# Patient Record
Sex: Female | Born: 1940
Health system: Southern US, Community
[De-identification: ages and names within clinical notes are randomized; demographics above are authoritative.]

## PROBLEM LIST (undated history)

## (undated) DIAGNOSIS — M545 Low back pain, unspecified: Secondary | ICD-10-CM

## (undated) DIAGNOSIS — I10 Essential (primary) hypertension: Secondary | ICD-10-CM

## (undated) DIAGNOSIS — E785 Hyperlipidemia, unspecified: Secondary | ICD-10-CM

## (undated) HISTORY — PX: ABDOMINAL HYSTERECTOMY: SHX81

## (undated) HISTORY — PX: TONSILLECTOMY: SUR1361

## (undated) HISTORY — PX: CHOLECYSTECTOMY: SHX55

## (undated) HISTORY — DX: Essential (primary) hypertension: I10

## (undated) HISTORY — DX: Low back pain, unspecified: M54.50

## (undated) HISTORY — PX: APPENDECTOMY: SHX54

## (undated) HISTORY — DX: Hyperlipidemia, unspecified: E78.5

## (undated) HISTORY — PX: OVARIAN CYST SURGERY: SHX726

## (undated) HISTORY — DX: Low back pain: M54.5

---

## 2002-04-11 ENCOUNTER — Other Ambulatory Visit: Admission: RE | Admit: 2002-04-11 | Discharge: 2002-04-11 | Payer: Self-pay | Admitting: Family Medicine

## 2009-08-05 ENCOUNTER — Ambulatory Visit (HOSPITAL_COMMUNITY): Admission: RE | Admit: 2009-08-05 | Discharge: 2009-08-05 | Payer: Self-pay | Admitting: Obstetrics and Gynecology

## 2009-08-25 ENCOUNTER — Ambulatory Visit (HOSPITAL_COMMUNITY): Admission: RE | Admit: 2009-08-25 | Discharge: 2009-08-25 | Payer: Self-pay | Admitting: Obstetrics and Gynecology

## 2009-12-16 ENCOUNTER — Encounter (INDEPENDENT_AMBULATORY_CARE_PROVIDER_SITE_OTHER): Payer: Self-pay | Admitting: Obstetrics and Gynecology

## 2009-12-16 ENCOUNTER — Inpatient Hospital Stay (HOSPITAL_COMMUNITY): Admission: RE | Admit: 2009-12-16 | Discharge: 2009-12-18 | Payer: Self-pay | Admitting: Obstetrics and Gynecology

## 2010-11-10 LAB — CBC
HCT: 43.8 % (ref 36.0–46.0)
Hemoglobin: 10.8 g/dL — ABNORMAL LOW (ref 12.0–15.0)
Hemoglobin: 14.9 g/dL (ref 12.0–15.0)
MCHC: 34.2 g/dL (ref 30.0–36.0)
MCHC: 34.5 g/dL (ref 30.0–36.0)
MCHC: 34.6 g/dL (ref 30.0–36.0)
Platelets: 161 10*3/uL (ref 150–400)
RBC: 3.15 MIL/uL — ABNORMAL LOW (ref 3.87–5.11)
RBC: 4.85 MIL/uL (ref 3.87–5.11)
RDW: 12.1 % (ref 11.5–15.5)
RDW: 12.2 % (ref 11.5–15.5)

## 2010-11-10 LAB — BASIC METABOLIC PANEL
BUN: 12 mg/dL (ref 6–23)
CO2: 29 mEq/L (ref 19–32)
Calcium: 8.3 mg/dL — ABNORMAL LOW (ref 8.4–10.5)
Creatinine, Ser: 0.94 mg/dL (ref 0.4–1.2)
Glucose, Bld: 147 mg/dL — ABNORMAL HIGH (ref 70–99)
Sodium: 136 mEq/L (ref 135–145)

## 2010-11-10 LAB — COMPREHENSIVE METABOLIC PANEL
ALT: 21 U/L (ref 0–35)
Alkaline Phosphatase: 75 U/L (ref 39–117)
BUN: 15 mg/dL (ref 6–23)
CO2: 28 mEq/L (ref 19–32)
Calcium: 9.2 mg/dL (ref 8.4–10.5)
GFR calc non Af Amer: >60 mL/min (ref >60)
Glucose, Bld: 87 mg/dL (ref 70–99)
Sodium: 141 mEq/L (ref 135–145)
Total Protein: 8 g/dL (ref 6.0–8.3)

## 2010-11-10 LAB — URINALYSIS, ROUTINE W REFLEX MICROSCOPIC
Bilirubin Urine: NEGATIVE
Ketones, ur: NEGATIVE mg/dL
Ketones, ur: NEGATIVE mg/dL
Nitrite: NEGATIVE
Protein, ur: 30 mg/dL — AB
Specific Gravity, Urine: 1.01 (ref 1.005–1.030)
pH: 5.5 (ref 5.0–8.0)
pH: 6.5 (ref 5.0–8.0)

## 2010-11-10 LAB — URINE CULTURE: Colony Count: 65000

## 2010-11-10 LAB — URINE MICROSCOPIC-ADD ON

## 2010-11-23 LAB — COMPREHENSIVE METABOLIC PANEL
ALT: 22 U/L (ref 0–35)
AST: 23 U/L (ref 0–37)
CO2: 29 mEq/L (ref 19–32)
Chloride: 102 mEq/L (ref 96–112)
Creatinine, Ser: 0.66 mg/dL (ref 0.4–1.2)
GFR calc Af Amer: >60 mL/min (ref >60)
GFR calc non Af Amer: >60 mL/min (ref >60)
Sodium: 138 mEq/L (ref 135–145)
Total Bilirubin: 0.6 mg/dL (ref 0.3–1.2)

## 2010-11-23 LAB — URINALYSIS, ROUTINE W REFLEX MICROSCOPIC
Bilirubin Urine: NEGATIVE
Glucose, UA: NEGATIVE mg/dL
Ketones, ur: NEGATIVE mg/dL
pH: 5.5 (ref 5.0–8.0)

## 2010-11-23 LAB — CBC
MCV: 92.7 fL (ref 78.0–100.0)
RBC: 4.94 MIL/uL (ref 3.87–5.11)
WBC: 8.8 10*3/uL (ref 4.0–10.5)

## 2012-11-07 ENCOUNTER — Other Ambulatory Visit: Payer: Self-pay | Admitting: *Deleted

## 2012-11-14 ENCOUNTER — Telehealth: Payer: Self-pay | Admitting: Family Medicine

## 2012-11-14 NOTE — Telephone Encounter (Signed)
Patient states that [facility] sent over a refill request for her Lovastatin last week and they have not heard back from our office. The patient has been without her medication for about a week now.

## 2012-11-24 ENCOUNTER — Encounter: Payer: Self-pay | Admitting: Family Medicine

## 2012-11-24 ENCOUNTER — Ambulatory Visit (INDEPENDENT_AMBULATORY_CARE_PROVIDER_SITE_OTHER): Payer: Medicare Other | Admitting: Family Medicine

## 2012-11-24 VITALS — BP 158/78 | HR 77 | Temp 97.0°F | Ht 65.0 in | Wt 159.8 lb

## 2012-11-24 DIAGNOSIS — I10 Essential (primary) hypertension: Secondary | ICD-10-CM | POA: Insufficient documentation

## 2012-11-24 DIAGNOSIS — M549 Dorsalgia, unspecified: Secondary | ICD-10-CM

## 2012-11-24 DIAGNOSIS — E785 Hyperlipidemia, unspecified: Secondary | ICD-10-CM

## 2012-11-24 DIAGNOSIS — G8929 Other chronic pain: Secondary | ICD-10-CM

## 2012-11-24 DIAGNOSIS — E559 Vitamin D deficiency, unspecified: Secondary | ICD-10-CM | POA: Insufficient documentation

## 2012-11-24 MED ORDER — BENAZEPRIL-HYDROCHLOROTHIAZIDE 20-25 MG PO TABS: 1.0000 | ORAL_TABLET | Freq: Every day | ORAL | Status: DC

## 2012-11-24 MED ORDER — ACETAMINOPHEN-CODEINE #3 300-30 MG PO TABS: 1.0000 | ORAL_TABLET | Freq: Four times a day (QID) | ORAL | Status: DC | PRN

## 2012-11-24 MED ORDER — AMLODIPINE BESYLATE 5 MG PO TABS: 7.5000 mg | ORAL_TABLET | Freq: Every day | ORAL | Status: DC

## 2012-11-24 MED ORDER — LOVASTATIN 40 MG PO TABS: 40.0000 mg | ORAL_TABLET | Freq: Every day | ORAL | Status: DC

## 2012-11-24 NOTE — Patient Instructions (Signed)
Diet and Exercise discussed with patient. For nutrition information, I recommend books: Eat to Live by Dr Monico Hoar. Prevent and Reverse Heart Disease by Dr Suzzette Righter.  Exercise recommendations are:  If unable to walk, then the patient can exercise in a chair 3 times a day. By flapping arms like a bird gently and raising legs outwards to the front.  If ambulatory, the patient can go for walks for 30 minutes 3 times a week. Then increase the intensity and duration as tolerated. Goal is to try to attain exercise frequency to 5 times a week. Best to perform resistance exercises 2 days a week and cardio type exercises 3 days per week. Hypertension As your heart beats, it forces blood through your arteries. This force is your blood pressure. If the pressure is too high, it is called hypertension (HTN) or high blood pressure. HTN is dangerous because you may have it and not know it. High blood pressure may mean that your heart has to work harder to pump blood. Your arteries may be narrow or stiff. The extra work puts you at risk for heart disease, stroke, and other problems.  Blood pressure consists of two numbers, a higher number over a lower, 110/72, for example. It is stated as "110 over 72." The ideal is below 120 for the top number (systolic) and under 80 for the bottom (diastolic). Write down your blood pressure today. You should pay close attention to your blood pressure if you have certain conditions such as:  Heart failure.  Prior heart attack.  Diabetes  Chronic kidney disease.  Prior stroke.  Multiple risk factors for heart disease. To see if you have HTN, your blood pressure should be measured while you are seated with your arm held at the level of the heart. It should be measured at least twice. A one-time elevated blood pressure reading (especially in the Emergency Department) does not mean that you need treatment. There may be conditions in which the blood pressure is  different between your right and left arms. It is important to see your caregiver soon for a recheck. Most people have essential hypertension which means that there is not a specific cause. This type of high blood pressure may be lowered by changing lifestyle factors such as:  Stress.  Smoking.  Lack of exercise.  Excessive weight.  Drug/tobacco/alcohol use.  Eating less salt. Most people do not have symptoms from high blood pressure until it has caused damage to the body. Effective treatment can often prevent, delay or reduce that damage. TREATMENT  When a cause has been identified, treatment for high blood pressure is directed at the cause. There are a large number of medications to treat HTN. These fall into several categories, and your caregiver will help you select the medicines that are best for you. Medications may have side effects. You should review side effects with your caregiver. If your blood pressure stays high after you have made lifestyle changes or started on medicines,   Your medication(s) may need to be changed.  Other problems may need to be addressed.  Be certain you understand your prescriptions, and know how and when to take your medicine.  Be sure to follow up with your caregiver within the time frame advised (usually within two weeks) to have your blood pressure rechecked and to review your medications.  If you are taking more than one medicine to lower your blood pressure, make sure you know how and at what times they should  be taken. Taking two medicines at the same time can result in blood pressure that is too low. SEEK IMMEDIATE MEDICAL CARE IF:  You develop a severe headache, blurred or changing vision, or confusion.  You have unusual weakness or numbness, or a faint feeling.  You have severe chest or abdominal pain, vomiting, or breathing problems. MAKE SURE YOU:   Understand these instructions.  Will watch your condition.  Will get help right  away if you are not doing well or get worse. Document Released: 08/09/2005 Document Revised: 11/01/2011 Document Reviewed: 03/29/2008 [facility] Patient Information 2013 [place], [place].  Hypertriglyceridemia  Diet for High blood levels of Triglycerides Most fats in food are triglycerides. Triglycerides in your blood are stored as fat in your body. High levels of triglycerides in your blood may put you at a greater risk for heart disease and stroke.  Normal triglyceride levels are less than 150 mg/dL. Borderline high levels are 150-199 mg/dl. High levels are 200 - 499 mg/dL, and very high triglyceride levels are greater than 500 mg/dL. The decision to treat high triglycerides is generally based on the level. For people with borderline or high triglyceride levels, treatment includes weight loss and exercise. Drugs are recommended for people with very high triglyceride levels. Many people who need treatment for high triglyceride levels have metabolic syndrome. This syndrome is a collection of disorders that often include: insulin resistance, high blood pressure, blood clotting problems, high cholesterol and triglycerides. TESTING PROCEDURE FOR TRIGLYCERIDES  You should not eat 4 hours before getting your triglycerides measured. The normal range of triglycerides is between 10 and 250 milligrams per deciliter (mg/dl). Some people may have extreme levels (1000 or above), but your triglyceride level may be too high if it is above 150 mg/dl, depending on what other risk factors you have for heart disease.  People with high blood triglycerides may also have high blood cholesterol levels. If you have high blood cholesterol as well as high blood triglycerides, your risk for heart disease is probably greater than if you only had high triglycerides. High blood cholesterol is one of the main risk factors for heart disease. CHANGING YOUR DIET  Your weight can affect your blood triglyceride level. If you are more than  20% above your ideal body weight, you may be able to lower your blood triglycerides by losing weight. Eating less and exercising regularly is the best way to combat this. Fat provides more calories than any other food. The best way to lose weight is to eat less fat. Only 30% of your total calories should come from fat. Less than 7% of your diet should come from saturated fat. A diet low in fat and saturated fat is the same as a diet to decrease blood cholesterol. By eating a diet lower in fat, you may lose weight, lower your blood cholesterol, and lower your blood triglyceride level.  Eating a diet low in fat, especially saturated fat, may also help you lower your blood triglyceride level. Ask your dietitian to help you figure how much fat you can eat based on the number of calories your caregiver has prescribed for you.  Exercise, in addition to helping with weight loss may also help lower triglyceride levels.   Alcohol can increase blood triglycerides. You may need to stop drinking alcoholic beverages.  Too much carbohydrate in your diet may also increase your blood triglycerides. Some complex carbohydrates are necessary in your diet. These may include bread, rice, potatoes, other starchy vegetables and cereals.  Reduce "simple" carbohydrates. These may include pure sugars, candy, honey, and jelly without losing other nutrients. If you have the kind of high blood triglycerides that is affected by the amount of carbohydrates in your diet, you will need to eat less sugar and less high-sugar foods. Your caregiver can help you with this.  Adding 2-4 grams of fish oil (EPA+ DHA) may also help lower triglycerides. Speak with your caregiver before adding any supplements to your regimen. Following the Diet  Maintain your ideal weight. Your caregivers can help you with a diet. Generally, eating less food and getting more exercise will help you lose weight. Joining a weight control group may also help. Ask your  caregivers for a good weight control group in your area.  Eat low-fat foods instead of high-fat foods. This can help you lose weight too.  These foods are lower in fat. Eat MORE of these:   Dried beans, peas, and lentils.  Egg whites.  Low-fat cottage cheese.  Fish.  Lean cuts of meat, such as round, sirloin, rump, and flank (cut extra fat off meat you fix).  Whole grain breads, cereals and pasta.  Skim and nonfat dry milk.  Low-fat yogurt.  Poultry without the skin.  Cheese made with skim or part-skim milk, such as mozzarella, parmesan, farmers', ricotta, or pot cheese. These are higher fat foods. Eat LESS of these:   Whole milk and foods made from whole milk, such as American, blue, cheddar, monterey jack, and swiss cheese  High-fat meats, such as luncheon meats, sausages, knockwurst, bratwurst, hot dogs, ribs, corned beef, ground pork, and regular ground beef.  Fried foods. Limit saturated fats in your diet. Substituting unsaturated fat for saturated fat may decrease your blood triglyceride level. You will need to read package labels to know which products contain saturated fats.  These foods are high in saturated fat. Eat LESS of these:   Fried pork skins.  Whole milk.  Skin and fat from poultry.  Palm oil.  Butter.  Shortening.  Cream cheese.  Tomasa Blase.  Margarines and baked goods made from listed oils.  Vegetable shortenings.  Chitterlings.  Fat from meats.  Coconut oil.  Palm kernel oil.  Lard.  Cream.  Sour cream.  Fatback.  Coffee whiteners and non-dairy creamers made with these oils.  Cheese made from whole milk. Use unsaturated fats (both polyunsaturated and monounsaturated) moderately. Remember, even though unsaturated fats are better than saturated fats; you still want a diet low in total fat.  These foods are high in unsaturated fat:   Canola oil.  Sunflower oil.  Mayonnaise.  Almonds.  Peanuts.  Pine nuts.  Margarines  made with these oils.  Safflower oil.  Olive oil.  Avocados.  Cashews.  Peanut butter.  Sunflower seeds.  Soybean oil.  Peanut oil.  Olives.  Pecans.  Walnuts.  Pumpkin seeds. Avoid sugar and other high-sugar foods. This will decrease carbohydrates without decreasing other nutrients. Sugar in your food goes rapidly to your blood. When there is excess sugar in your blood, your liver may use it to make more triglycerides. Sugar also contains calories without other important nutrients.  Eat LESS of these:   Sugar, brown sugar, powdered sugar, jam, jelly, preserves, honey, syrup, molasses, pies, candy, cakes, cookies, frosting, pastries, colas, soft drinks, punches, fruit drinks, and regular gelatin.  Avoid alcohol. Alcohol, even more than sugar, may increase blood triglycerides. In addition, alcohol is high in calories and low in nutrients. Ask for sparkling water, or a  diet soft drink instead of an alcoholic beverage. Suggestions for planning and preparing meals   Bake, broil, grill or roast meats instead of frying.  Remove fat from meats and skin from poultry before cooking.  Add spices, herbs, lemon juice or vinegar to vegetables instead of salt, rich sauces or gravies.  Use a non-stick skillet without fat or use no-stick sprays.  Cool and refrigerate stews and broth. Then remove the hardened fat floating on the surface before serving.  Refrigerate meat drippings and skim off fat to make low-fat gravies.  Serve more fish.  Use less butter, margarine and other high-fat spreads on bread or vegetables.  Use skim or reconstituted non-fat dry milk for cooking.  Cook with low-fat cheeses.  Substitute low-fat yogurt or cottage cheese for all or part of the sour cream in recipes for sauces, dips or congealed salads.  Use half yogurt/half mayonnaise in salad recipes.  Substitute evaporated skim milk for cream. Evaporated skim milk or reconstituted non-fat dry milk can  be whipped and substituted for whipped cream in certain recipes.  Choose fresh fruits for dessert instead of high-fat foods such as pies or cakes. Fruits are naturally low in fat. When Dining Out   Order low-fat appetizers such as fruit or vegetable juice, pasta with vegetables or tomato sauce.  Select clear, rather than cream soups.  Ask that dressings and gravies be served on the side. Then use less of them.  Order foods that are baked, broiled, poached, steamed, stir-fried, or roasted.  Ask for margarine instead of butter, and use only a small amount.  Drink sparkling water, unsweetened tea or coffee, or diet soft drinks instead of alcohol or other sweet beverages. QUESTIONS AND ANSWERS ABOUT OTHER FATS IN THE BLOOD: SATURATED FAT, TRANS FAT, AND CHOLESTEROL What is trans fat? Trans fat is a type of fat that is formed when vegetable oil is hardened through a process called hydrogenation. This process helps makes foods more solid, gives them shape, and prolongs their shelf life. Trans fats are also called hydrogenated or partially hydrogenated oils.  What do saturated fat, trans fat, and cholesterol in foods have to do with heart disease? Saturated fat, trans fat, and cholesterol in the diet all raise the level of LDL "bad" cholesterol in the blood. The higher the LDL cholesterol, the greater the risk for coronary heart disease (CHD). Saturated fat and trans fat raise LDL similarly.  What foods contain saturated fat, trans fat, and cholesterol? High amounts of saturated fat are found in animal products, such as fatty cuts of meat, chicken skin, and full-fat dairy products like butter, whole milk, cream, and cheese, and in tropical vegetable oils such as palm, palm kernel, and coconut oil. Trans fat is found in some of the same foods as saturated fat, such as vegetable shortening, some margarines (especially hard or stick margarine), crackers, cookies, baked goods, fried foods, salad  dressings, and other processed foods made with partially hydrogenated vegetable oils. Small amounts of trans fat also occur naturally in some animal products, such as milk products, beef, and lamb. Foods high in cholesterol include liver, other organ meats, egg yolks, shrimp, and full-fat dairy products. How can I use the new food label to make heart-healthy food choices? Check the Nutrition Facts panel of the food label. Choose foods lower in saturated fat, trans fat, and cholesterol. For saturated fat and cholesterol, you can also use the Percent Daily Value (%DV): 5% DV or less is low, and 20% DV  or more is high. (There is no %DV for trans fat.) Use the Nutrition Facts panel to choose foods low in saturated fat and cholesterol, and if the trans fat is not listed, read the ingredients and limit products that list shortening or hydrogenated or partially hydrogenated vegetable oil, which tend to be high in trans fat. POINTS TO REMEMBER:   Discuss your risk for heart disease with your caregivers, and take steps to reduce risk factors.  Change your diet. Choose foods that are low in saturated fat, trans fat, and cholesterol.  Add exercise to your daily routine if it is not already being done. Participate in physical activity of moderate intensity, like brisk walking, for at least 30 minutes on most, and preferably all days of the week. No time? Break the 30 minutes into three, 10-minute segments during the day.  Stop smoking. If you do smoke, contact your caregiver to discuss ways in which they can help you quit.  Do not use street drugs.  Maintain a normal weight.  Maintain a healthy blood pressure.  Keep up with your blood work for checking the fats in your blood as directed by your caregiver. Document Released: 05/27/2004 Document Revised: 02/08/2012 Document Reviewed: 12/23/2008 [facility] Patient Information 2013 [place], [place].

## 2012-11-24 NOTE — Progress Notes (Signed)
Patient ID: Belinda Scott, female   DOB: Feb 14, 1941, 72 y.o.   MRN: [id number] SUBJECTIVE:   HPI: Patient is here for follow up of hypertension: deniesHeadache;deniesChest Pain;deniesweakness;deniesShortness of Breath or Orthopnea;deniesVisual changes;deniespalpitations;deniescough;deniespedal edema;deniessymptoms of TIA or stroke; admits toCompliance with medications. admits toProblems with medications.Not refilled because did not come in for labs. Patient is here for follow up of hyperlipidemia: deniesHeadache;deniesChest Pain;deniesweakness;deniesShortness of Breath and orthopnea;deniesVisual changes;deniespalpitations;deniescough;deniespedal edema;deniessymptoms of TIA or stroke;deniesClaudication symptoms. admits toCompliance with medications; admits toProblems with medications.   PMH/PSH: reviewed/updated in Epic  SH/FH: reviewed/updated in Epic  Allergies: reviewed/updated in Epic  Medications: reviewed/updated in Epic  Immunizations: reviewed/updated in Epic  ROS: Chronic back pain needing relief. OBJECTIVE:     Elderly white female. Patient in no acute distress.The patient appeared well nourished and normally developed. Acyanotic.  Waist:Not measured  VITAL SIGNS:BP 158/78  Pulse 77  Temp(Src) 97 F (36.1 C) (Oral)  Ht 5\' 5"  (1.651 m)  Wt 159 lb 12.8 oz (72.485 kg)  BMI 26.59 kg/m2   SKIN: warm and  Dry without overt rashes, tattoos and scars  HEAD and Neck: without JVD, Normal No scleral icterus  CHEST & LUNGS: Clear  CVS: Reveals the PMI to be normally located. Regular rhythm, First and Second Heart sounds are normal, and absence of murmurs, rubs or gallops.  ABDOMEN:  Benign,, no organomegaly, no masses, no Abdominal Aortic enlargement. No Guarding , no rebound. No Bruits.  RECTAL:  GU:  EXTREMETIES: nonedematous.  MUSCULOSKELETAL:  Spine: limited ROM with pain Joints: Crepitus of knees  NEUROLOGIC: oriented to time,place and person;  nonfocal. Strength is normal Sensory is normal Reflexes are normal Cranial Nerves are normal.  ASSESSMENT:  HTN (hypertension) Has not taken any BP meds yet. Also was a little upset that her Rx were denied due to not being seen and coming for labs for over a year. Otherwise her BP stays well controlled.  HLD (hyperlipidemia) Has been out for a couple of weeks on her lipid meds. No problem with it.  Back pain, chronic Has had oxycodone in the past and would like something for her spine arthritis. It uncomfortable to lie down at nights and to get around. Moving her shoulder produces pain. Has no plans for surgery or interventions. Does not want Xrays or orthopedics referral.  Unspecified vitamin D deficiency Stable. Adequate  Replacement.    PLAN: Orders Placed This Encounter  Procedures  . Vitamin D 25 hydroxy    Standing Status: Future     Number of Occurrences:      Standing Expiration Date: 01/24/2013  . COMPLETE METABOLIC PANEL WITH GFR    Standing Status: Future     Number of Occurrences:      Standing Expiration Date: 01/24/2013  . NMR Lipoprofile with Lipids    Standing Status: Future     Number of Occurrences:      Standing Expiration Date: 01/24/2013                                 Meds ordered this encounter  Medications  . DISCONTD: amLODipine (NORVASC) 5 MG tablet    Sig:   . DISCONTD: benazepril-hydrochlorthiazide (LOTENSIN HCT) 20-25 MG per tablet    Sig:   . DISCONTD: lovastatin (MEVACOR) 40 MG tablet    Sig:   . Cholecalciferol (VITAMIN D3) 2000 UNITS CHEW    Sig: Chew 1 each by mouth 2 (two) times a week.  Marland Kitchen  Ascorbic Acid 500 MG CHEW    Sig: Chew 1 each by mouth as needed.  Marland Kitchen amLODipine (NORVASC) 5 MG tablet    Sig: Take 1.5 tablets (7.5 mg total) by mouth daily.    Dispense:  45 tablet    Refill:  5  . benazepril-hydrochlorthiazide (LOTENSIN HCT) 20-25 MG per tablet    Sig: Take 1 tablet by mouth daily.    Dispense:  30 tablet    Refill:  5  .  lovastatin (MEVACOR) 40 MG tablet    Sig: Take 1 tablet (40 mg total) by mouth at bedtime.    Dispense:  30 tablet    Refill:  5  . acetaminophen-codeine (TYLENOL #3) 300-30 MG per tablet    Sig: Take 1 tablet by mouth every 6 (six) hours as needed for pain.    Dispense:  50 tablet    Refill:  0  Diet and Exercise discussed with patient. For nutrition information, I recommend books: Eat to Live by Dr Monico Hoar. Prevent and Reverse Heart Disease by Dr Suzzette Righter.  Exercise recommendations are:  If unable to walk, then the patient can exercise in a chair 3 times a day. By flapping arms like a bird gently and raising legs outwards to the front.  If ambulatory, the patient can go for walks for 30 minutes 3 times a week. Then increase the intensity and duration as tolerated. Goal is to try to attain exercise frequency to 5 times a week. Best to perform resistance exercises 2 days a week and cardio type exercises 3 days per week. Handouts on hyperlipidemia and hypertension. Given to patient Maryla Morrow. Modesto Charon, M.D.

## 2012-11-24 NOTE — Assessment & Plan Note (Signed)
Has had oxycodone in the past and would like something for her spine arthritis. It uncomfortable to lie down at nights and to get around. Moving her shoulder produces pain. Has no plans for surgery or interventions. Does not want Xrays or orthopedics referral.

## 2012-11-24 NOTE — Assessment & Plan Note (Signed)
Stable. Adequate  Replacement.

## 2012-11-24 NOTE — Assessment & Plan Note (Signed)
Has been out for a couple of weeks on her lipid meds. No problem with it.

## 2012-11-24 NOTE — Assessment & Plan Note (Signed)
Has not taken any BP meds yet. Also was a little upset that her Rx were denied due to not being seen and coming for labs for over a year. Otherwise her BP stays well controlled.

## 2012-12-25 ENCOUNTER — Other Ambulatory Visit (INDEPENDENT_AMBULATORY_CARE_PROVIDER_SITE_OTHER): Payer: Medicare Other

## 2012-12-25 DIAGNOSIS — E785 Hyperlipidemia, unspecified: Secondary | ICD-10-CM

## 2012-12-25 DIAGNOSIS — E559 Vitamin D deficiency, unspecified: Secondary | ICD-10-CM

## 2012-12-25 LAB — COMPLETE METABOLIC PANEL WITH GFR
ALT: 17 U/L (ref 0–35)
AST: 22 U/L (ref 0–37)
Albumin: 4.2 g/dL (ref 3.5–5.2)
Alkaline Phosphatase: 71 U/L (ref 39–117)
BUN: 15 mg/dL (ref 6–23)
CO2: 29 mEq/L (ref 19–32)
Calcium: 9.3 mg/dL (ref 8.4–10.5)
Chloride: 107 mEq/L (ref 96–112)
Creat: 0.71 mg/dL (ref 0.50–1.10)
GFR, Est African American: >89 mL/min
GFR, Est Non African American: 84 mL/min
Glucose, Bld: 90 mg/dL (ref 70–99)
Potassium: 4.3 mEq/L (ref 3.5–5.3)
Sodium: 144 mEq/L (ref 135–145)
Total Bilirubin: 0.7 mg/dL (ref 0.3–1.2)
Total Protein: 7.3 g/dL (ref 6.0–8.3)

## 2012-12-25 NOTE — Progress Notes (Signed)
Patient is here today for labs only. 

## 2012-12-26 LAB — VITAMIN D 25 HYDROXY (VIT D DEFICIENCY, FRACTURES): Vit D, 25-Hydroxy: 30 ng/mL (ref 30–89)

## 2012-12-27 LAB — NMR LIPOPROFILE WITH LIPIDS
Cholesterol, Total: 142 mg/dL (ref <200)
HDL Particle Number: 36.4 umol/L (ref >=30.5)
HDL Size: 8.9 nm — ABNORMAL LOW (ref >=9.2)
HDL-C: 50 mg/dL (ref >=40)
LDL (calc): 70 mg/dL (ref <100)
LDL Particle Number: 937 nmol/L (ref <1000)
LDL Size: 20.5 nm — ABNORMAL LOW (ref >20.5)
LP-IR Score: 45 (ref <=45)
Large HDL-P: 5.6 umol/L (ref >=4.8)
Large VLDL-P: 1.7 nmol/L (ref <=2.7)
Small LDL Particle Number: 499 nmol/L (ref <=527)
Triglycerides: 110 mg/dL (ref <150)
VLDL Size: 44.9 nm (ref <=46.6)

## 2012-12-27 NOTE — Progress Notes (Signed)
Quick Note:  Lab result at goal. No change in Medications for now. No Change in plans and follow up. ______ 

## 2013-03-01 ENCOUNTER — Other Ambulatory Visit: Payer: Self-pay | Admitting: Family Medicine

## 2013-03-02 NOTE — Telephone Encounter (Signed)
Last filled and seen 11/24/12, If approved Have Almira Coaster H call pt to [facility]

## 2013-05-08 ENCOUNTER — Ambulatory Visit (INDEPENDENT_AMBULATORY_CARE_PROVIDER_SITE_OTHER): Payer: Medicare Other | Admitting: Family Medicine

## 2013-05-08 VITALS — BP 164/75 | HR 90 | Temp 99.0°F | Ht 64.5 in | Wt 159.2 lb

## 2013-05-08 DIAGNOSIS — M549 Dorsalgia, unspecified: Secondary | ICD-10-CM

## 2013-05-08 DIAGNOSIS — J209 Acute bronchitis, unspecified: Secondary | ICD-10-CM | POA: Insufficient documentation

## 2013-05-08 DIAGNOSIS — G8929 Other chronic pain: Secondary | ICD-10-CM

## 2013-05-08 DIAGNOSIS — J069 Acute upper respiratory infection, unspecified: Secondary | ICD-10-CM | POA: Insufficient documentation

## 2013-05-08 DIAGNOSIS — E559 Vitamin D deficiency, unspecified: Secondary | ICD-10-CM

## 2013-05-08 DIAGNOSIS — I10 Essential (primary) hypertension: Secondary | ICD-10-CM

## 2013-05-08 DIAGNOSIS — E785 Hyperlipidemia, unspecified: Secondary | ICD-10-CM

## 2013-05-08 MED ORDER — AZITHROMYCIN 250 MG PO TABS: ORAL_TABLET | ORAL | Status: DC

## 2013-05-08 NOTE — Progress Notes (Signed)
Patient ID: Belinda Scott, female   DOB: 25-May-1941, 72 y.o.   MRN: [id number] SUBJECTIVE: CC: Chief Complaint  Patient presents with  . head and chest congestion    x 1 week, fever, prod cough.      HPI: Symptoms of sneezing and runny nose for 1 week. Followed by low grade fever and now a cough productive. No SOB, no Chest pain, no wheezes. Has used OTC cough and congestion products and she feels it may have raised her BP a little, but her BP reads normal for her all the time at home per patient.   Past Medical History  Diagnosis Date  . Hypertension   . Hyperlipidemia    Past Surgical History  Procedure Laterality Date  . Ovarian cyst surgery    . Appendectomy    . Cholecystectomy    . Tonsillectomy     History   Social History  . Marital Status: Married    Spouse Name: N/A    Number of Children: N/A  . Years of Education: N/A   Occupational History  . Not on file.   Social History Main Topics  . Smoking status: Never Smoker   . Smokeless tobacco: Not on file  . Alcohol Use: No  . Drug Use: No  . Sexual Activity: Not on file   Other Topics Concern  . Not on file   Social History Narrative  . No narrative on file   Family History  Problem Relation Age of Onset  . Dementia Mother   . Cancer Father    Current Outpatient Prescriptions on File Prior to Visit  Medication Sig Dispense Refill  . acetaminophen-codeine (TYLENOL #3) 300-30 MG per tablet TAKE ONE TABLET BY MOUTH EVERY 6 HOURS AS NEEDED FOR PAIN  50 tablet  0  . amLODipine (NORVASC) 5 MG tablet Take 1.5 tablets (7.5 mg total) by mouth daily.  45 tablet  5  . Ascorbic Acid 500 MG CHEW Chew 1 each by mouth as needed.      . benazepril-hydrochlorthiazide (LOTENSIN HCT) 20-25 MG per tablet Take 1 tablet by mouth daily.  30 tablet  5  . Cholecalciferol (VITAMIN D3) 2000 UNITS CHEW Chew 1 each by mouth 2 (two) times a week.      . lovastatin (MEVACOR) 40 MG tablet Take 1 tablet (40 mg total) by mouth at  bedtime.  30 tablet  5   No current facility-administered medications on file prior to visit.   No Known Allergies Immunization History  Administered Date(s) Administered  . Pneumococcal Polysaccharide 08/23/2005  . Tdap 08/24/2007   Prior to Admission medications   Medication Sig Start Date End Date Taking? Authorizing Provider  acetaminophen-codeine (TYLENOL #3) 300-30 MG per tablet TAKE ONE TABLET BY MOUTH EVERY 6 HOURS AS NEEDED FOR PAIN 03/01/13  Yes Ileana Ladd, MD  amLODipine (NORVASC) 5 MG tablet Take 1.5 tablets (7.5 mg total) by mouth daily. 11/24/12  Yes Ileana Ladd, MD  Ascorbic Acid 500 MG CHEW Chew 1 each by mouth as needed.   Yes Historical Provider, MD  benazepril-hydrochlorthiazide (LOTENSIN HCT) 20-25 MG per tablet Take 1 tablet by mouth daily. 11/24/12  Yes Ileana Ladd, MD  Cholecalciferol (VITAMIN D3) 2000 UNITS CHEW Chew 1 each by mouth 2 (two) times a week.   Yes Historical Provider, MD  lovastatin (MEVACOR) 40 MG tablet Take 1 tablet (40 mg total) by mouth at bedtime. 11/24/12  Yes Ileana Ladd, MD    ROS:  As above in the HPI. All other systems are stable or negative.  OBJECTIVE: APPEARANCE:  Patient in no acute distress.The patient appeared well nourished and normally developed. Acyanotic. Waist: VITAL SIGNS:BP 164/75  Pulse 90  Temp(Src) 99 F (37.2 C) (Oral)  Ht 5' 4.5" (1.638 m)  Wt 159 lb 3.2 oz (72.213 kg)  BMI 26.91 kg/m2 BP 140/70 WF  SKIN: warm and  Dry without overt rashes, tattoos and scars  HEAD and Neck: without JVD, Head and scalp: normal Eyes:No scleral icterus. Fundi normal, eye movements normal. Ears: Auricle normal, canal normal, Tympanic membranes normal, insufflation normal. Nose: coryza Throat: hyperemoic Neck & thyroid: normal  CHEST & LUNGS: Chest wall: normal Lungs: Coarse breath sounds with rhonchi. No rales , no wheezes  CVS: Reveals the PMI to be normally located. Regular rhythm, First and Second Heart sounds  are normal,  absence of murmurs, rubs or gallops. Peripheral vasculature: Radial pulses: normal Dorsal pedis pulses: normal Posterior pulses: normal  ABDOMEN:  Appearance: normal Benign, no organomegaly, no masses, no Abdominal Aortic enlargement. No Guarding , no rebound. No Bruits. Bowel sounds: normal  EXTREMETIES: nonedematous.  NEUROLOGIC: oriented to time,place and person; nonfocal.  ASSESSMENT: Acute bronchitis - Plan: azithromycin (ZITHROMAX) 250 MG tablet  Acute upper respiratory infections of unspecified site  HLD (hyperlipidemia)  HTN (hypertension)  Back pain, chronic  Unspecified vitamin D deficiency  PLAN: Meds ordered this encounter  Medications  . azithromycin (ZITHROMAX) 250 MG tablet    Sig: 2 tabs on Day1 and 1 tab on day 2 to 5    Dispense:  6 each    Refill:  0   Avoid  Decongestants that will raise her BP. Monitor her BP closely and incraese fluids, rest .  Expectant recovery n another week.  Okay to use honey for sorethroat and cough, especially buckwheat honey.  No Follow-up on file. Prn  Jailen Coward P. [place] Charon, M.D.

## 2013-06-07 ENCOUNTER — Encounter: Payer: Self-pay | Admitting: Family Medicine

## 2013-06-07 ENCOUNTER — Ambulatory Visit (INDEPENDENT_AMBULATORY_CARE_PROVIDER_SITE_OTHER): Payer: Medicare Other | Admitting: Family Medicine

## 2013-06-07 VITALS — BP 174/75 | HR 59 | Temp 97.1°F | Ht 64.0 in | Wt 160.2 lb

## 2013-06-07 DIAGNOSIS — E559 Vitamin D deficiency, unspecified: Secondary | ICD-10-CM

## 2013-06-07 DIAGNOSIS — I10 Essential (primary) hypertension: Secondary | ICD-10-CM

## 2013-06-07 DIAGNOSIS — G8929 Other chronic pain: Secondary | ICD-10-CM

## 2013-06-07 DIAGNOSIS — E785 Hyperlipidemia, unspecified: Secondary | ICD-10-CM

## 2013-06-07 DIAGNOSIS — M549 Dorsalgia, unspecified: Secondary | ICD-10-CM

## 2013-06-07 DIAGNOSIS — Z23 Encounter for immunization: Secondary | ICD-10-CM | POA: Insufficient documentation

## 2013-06-07 MED ORDER — LOVASTATIN 40 MG PO TABS: 40.0000 mg | ORAL_TABLET | Freq: Every day | ORAL | Status: DC

## 2013-06-07 MED ORDER — BENAZEPRIL-HYDROCHLOROTHIAZIDE 20-25 MG PO TABS: 1.0000 | ORAL_TABLET | Freq: Every day | ORAL | Status: DC

## 2013-06-07 MED ORDER — AMLODIPINE BESYLATE 5 MG PO TABS: 7.5000 mg | ORAL_TABLET | Freq: Every day | ORAL | Status: DC

## 2013-06-07 NOTE — Progress Notes (Signed)
Patient ID: Belinda Scott, female   DOB: 03-Jan-1941, 72 y.o.   MRN: [id number] SUBJECTIVE: CC: Chief Complaint  Patient presents with  . Follow-up    6 MONTH  C/O LOW BACK PAIN AND GOES TO CHIROPRACTER WHO HAS DONE XR ON HER BACK  . Medication Refill    HPI: Patient is here for follow up of hyperlipidemia/htn/vit D def: denies Headache;denies Chest Pain;denies weakness;denies Shortness of Breath and orthopnea;denies Visual changes;denies palpitations;denies cough;denies pedal edema;denies symptoms of TIA or stroke;deniesClaudication symptoms. admits to Compliance with medications; denies Problems with medications.   Has chronic back pain and  Demanded a pain patch. This was discussed with patient: she sees a [facility] who has done  Xrays and does therapy on her back. No weakness no numbness in her legs. Pain is high per patient but would not score it 1 to 10.     Past Medical History  Diagnosis Date  . Hypertension   . Hyperlipidemia    Past Surgical History  Procedure Laterality Date  . Ovarian cyst surgery    . Appendectomy    . Cholecystectomy    . Tonsillectomy     History   Social History  . Marital Status: Married    Spouse Name: N/A    Number of Children: N/A  . Years of Education: N/A   Occupational History  . Not on file.   Social History Main Topics  . Smoking status: Never Smoker   . Smokeless tobacco: Not on file  . Alcohol Use: No  . Drug Use: No  . Sexual Activity: Not on file   Other Topics Concern  . Not on file   Social History Narrative  . No narrative on file   Family History  Problem Relation Age of Onset  . Dementia Mother   . Cancer Father    Current Outpatient Prescriptions on File Prior to Visit  Medication Sig Dispense Refill  . acetaminophen-codeine (TYLENOL #3) 300-30 MG per tablet TAKE ONE TABLET BY MOUTH EVERY 6 HOURS AS NEEDED FOR PAIN  50 tablet  0  . Cholecalciferol (VITAMIN D3) 2000 UNITS CHEW Chew 1 each by mouth 2  (two) times a week.      . Ascorbic Acid 500 MG CHEW Chew 1 each by mouth as needed.       No current facility-administered medications on file prior to visit.   No Known Allergies Immunization History  Administered Date(s) Administered  . Influenza,inj,Quad PF,36+ Mos 06/07/2013  . Pneumococcal Polysaccharide 08/23/2005  . Tdap 08/24/2007   Prior to Admission medications   Medication Sig Start Date End Date Taking? Authorizing Provider  acetaminophen-codeine (TYLENOL #3) 300-30 MG per tablet TAKE ONE TABLET BY MOUTH EVERY 6 HOURS AS NEEDED FOR PAIN 03/01/13   Ileana Ladd, MD  amLODipine (NORVASC) 5 MG tablet Take 1.5 tablets (7.5 mg total) by mouth daily. 11/24/12   Ileana Ladd, MD  Ascorbic Acid 500 MG CHEW Chew 1 each by mouth as needed.    Historical Provider, MD  azithromycin (ZITHROMAX) 250 MG tablet 2 tabs on Day1 and 1 tab on day 2 to 5 05/08/13   Ileana Ladd, MD  benazepril-hydrochlorthiazide (LOTENSIN HCT) 20-25 MG per tablet Take 1 tablet by mouth daily. 11/24/12   Ileana Ladd, MD  Cholecalciferol (VITAMIN D3) 2000 UNITS CHEW Chew 1 each by mouth 2 (two) times a week.    Historical Provider, MD  lovastatin (MEVACOR) 40 MG tablet Take 1 tablet (40 mg  total) by mouth at bedtime. 11/24/12   Ileana Ladd, MD     ROS: As above in the HPI. All other systems are stable or negative.  OBJECTIVE: APPEARANCE:  Patient in no acute distress.The patient appeared well nourished and normally developed. Acyanotic. Waist: VITAL SIGNS:BP 174/75  Pulse 59  Temp(Src) 97.1 F (36.2 C)  Ht 5\' 4"  (1.626 m)  Wt 160 lb 3.2 oz (72.666 kg)  BMI 27.48 kg/m2  WF SKIN: warm and  Dry without overt rashes, tattoos and scars  HEAD and Neck: without JVD, Head and scalp: normal Eyes:No scleral icterus. Fundi normal, eye movements normal. Ears: Auricle normal, canal normal, Tympanic membranes normal, insufflation normal. Nose: normal Throat: normal Neck & thyroid: normal  CHEST &  LUNGS: Chest wall: normal Lungs: Clear  CVS: Reveals the PMI to be normally located. Regular rhythm, First and Second Heart sounds are normal,  absence of murmurs, rubs or gallops. Peripheral vasculature: Radial pulses: normal Dorsal pedis pulses: normal Posterior pulses: normal  ABDOMEN:  Appearance: normal Benign, no organomegaly, no masses, no Abdominal Aortic enlargement. No Guarding , no rebound. No Bruits. Bowel sounds: normal  RECTAL: N/A GU: N/A  EXTREMETIES: nonedematous.  MUSCULOSKELETAL:  Spine: reduced ROM Joints: intact  NEUROLOGIC: oriented to time,place and person; nonfocal. Strength is normal Cranial Nerves are normal.  ASSESSMENT: HLD (hyperlipidemia) - Plan: lovastatin (MEVACOR) 40 MG tablet, CMP14+EGFR, Lipid panel  HTN (hypertension) - Plan: amLODipine (NORVASC) 5 MG tablet, benazepril-hydrochlorthiazide (LOTENSIN HCT) 20-25 MG per tablet, CMP14+EGFR  Back pain, chronic  Need for prophylactic vaccination and inoculation against influenza  Unspecified vitamin D deficiency - Plan: Vit D  25 hydroxy (rtn osteoporosis monitoring)  PLAN:  Orders Placed This Encounter  Procedures  . CMP14+EGFR  . Lipid panel  . Vit D  25 hydroxy (rtn osteoporosis monitoring)   Meds ordered this encounter  Medications  . lovastatin (MEVACOR) 40 MG tablet    Sig: Take 1 tablet (40 mg total) by mouth at bedtime.    Dispense:  30 tablet    Refill:  5  . amLODipine (NORVASC) 5 MG tablet    Sig: Take 1.5 tablets (7.5 mg total) by mouth daily.    Dispense:  45 tablet    Refill:  5  . benazepril-hydrochlorthiazide (LOTENSIN HCT) 20-25 MG per tablet    Sig: Take 1 tablet by mouth daily.    Dispense:  30 tablet    Refill:  5   Medications Discontinued During This Encounter  Medication Reason  . azithromycin (ZITHROMAX) 250 MG tablet Completed Course  . lovastatin (MEVACOR) 40 MG tablet Reorder  . amLODipine (NORVASC) 5 MG tablet Reorder  .  benazepril-hydrochlorthiazide (LOTENSIN HCT) 20-25 MG per tablet Reorder   Return in about 4 months (around 10/08/2013) for Recheck medical problems. Discussed the appropriate approach to evaluation and treatment of her chronic back pain. Discussed with patient that the lidoderm patch is for nerve related pain. And that fentanyl patch is not appropriate at this point. Further evaluation with a back specialist would be warrnated after I have performed appropriate neuro imaging. And if necessary a referral to the pain clinic. Patient declined further intervention in regards to her back because she says she has enough Xrays from the chiropractor.  Oluwademilade Kellett P. [place] Charon, M.D.

## 2013-06-08 LAB — CMP14+EGFR
ALT: 21 IU/L (ref 0–32)
AST: 25 IU/L (ref 0–40)
Albumin/Globulin Ratio: 1.6 (ref 1.1–2.5)
Albumin: 4.6 g/dL (ref 3.5–4.8)
Alkaline Phosphatase: 94 IU/L (ref 39–117)
BUN/Creatinine Ratio: 18 (ref 11–26)
BUN: 14 mg/dL (ref 8–27)
CO2: 27 mmol/L (ref 18–29)
Calcium: 10 mg/dL (ref 8.6–10.2)
Chloride: 98 mmol/L (ref 97–108)
Creatinine, Ser: 0.78 mg/dL (ref 0.57–1.00)
GFR calc Af Amer: 87 mL/min/{1.73_m2} (ref >59)
GFR calc non Af Amer: 75 mL/min/{1.73_m2} (ref >59)
Globulin, Total: 2.9 g/dL (ref 1.5–4.5)
Glucose: 97 mg/dL (ref 65–99)
Potassium: 4.2 mmol/L (ref 3.5–5.2)
Sodium: 143 mmol/L (ref 134–144)
Total Bilirubin: 0.6 mg/dL (ref 0.0–1.2)
Total Protein: 7.5 g/dL (ref 6.0–8.5)

## 2013-06-08 LAB — LIPID PANEL
Chol/HDL Ratio: 2.9 ratio units (ref 0.0–4.4)
Cholesterol, Total: 165 mg/dL (ref 100–199)
HDL: 56 mg/dL (ref >39)
LDL Calculated: 77 mg/dL (ref 0–99)
Triglycerides: 159 mg/dL — ABNORMAL HIGH (ref 0–149)
VLDL Cholesterol Cal: 32 mg/dL (ref 5–40)

## 2013-06-08 LAB — VITAMIN D 25 HYDROXY (VIT D DEFICIENCY, FRACTURES): Vit D, 25-Hydroxy: 27.5 ng/mL — ABNORMAL LOW (ref 30.0–100.0)

## 2013-10-16 ENCOUNTER — Telehealth: Payer: Self-pay | Admitting: Family Medicine

## 2013-10-19 NOTE — Telephone Encounter (Signed)
Needs to be seen. Sorry. That's a requirement.

## 2013-10-19 NOTE — Telephone Encounter (Signed)
Pt notified and pt states will contac her dentist

## 2014-01-02 ENCOUNTER — Encounter: Payer: Self-pay | Admitting: Family

## 2014-01-02 ENCOUNTER — Ambulatory Visit (INDEPENDENT_AMBULATORY_CARE_PROVIDER_SITE_OTHER): Payer: Medicare Other | Admitting: Family

## 2014-01-02 VITALS — BP 150/72 | HR 68 | Temp 98.4°F | Ht 64.0 in | Wt 162.6 lb

## 2014-01-02 DIAGNOSIS — E785 Hyperlipidemia, unspecified: Secondary | ICD-10-CM

## 2014-01-02 DIAGNOSIS — I10 Essential (primary) hypertension: Secondary | ICD-10-CM

## 2014-01-02 DIAGNOSIS — E559 Vitamin D deficiency, unspecified: Secondary | ICD-10-CM

## 2014-01-02 DIAGNOSIS — M549 Dorsalgia, unspecified: Secondary | ICD-10-CM

## 2014-01-02 DIAGNOSIS — G8929 Other chronic pain: Secondary | ICD-10-CM

## 2014-01-02 MED ORDER — MELOXICAM 7.5 MG PO TABS: 7.5000 mg | ORAL_TABLET | Freq: Every day | ORAL | Status: DC

## 2014-01-02 MED ORDER — AMLODIPINE BESYLATE 5 MG PO TABS: 7.5000 mg | ORAL_TABLET | Freq: Every day | ORAL | Status: DC

## 2014-01-02 MED ORDER — LOVASTATIN 40 MG PO TABS: 40.0000 mg | ORAL_TABLET | Freq: Every day | ORAL | Status: DC

## 2014-01-02 MED ORDER — BENAZEPRIL-HYDROCHLOROTHIAZIDE 20-25 MG PO TABS: 1.0000 | ORAL_TABLET | Freq: Every day | ORAL | Status: DC

## 2014-01-02 NOTE — Patient Instructions (Signed)

## 2014-01-02 NOTE — Progress Notes (Signed)
Subjective:    Patient ID: Belinda Scott, female    DOB: [birth year], 73 y.o.   MRN: [id number]  Hypertension This is a chronic problem. The current episode started more than 1 year ago. The problem has been waxing and waning since onset. The problem is uncontrolled. Pertinent negatives include no anxiety, blurred vision, chest pain, palpitations, peripheral edema or shortness of breath. Risk factors for coronary artery disease include dyslipidemia and post-menopausal state. Past treatments include calcium channel blockers, diuretics and ACE inhibitors. Improvement on treatment: Pt states she has not been taking norvasc. Compliance problems include medication side effects.  There is no history of kidney disease, CVA, heart failure or retinopathy. There is no history of chronic renal disease or sleep apnea.  Hyperlipidemia This is a chronic problem. The current episode started more than 1 year ago. The problem is controlled. Recent lipid tests were reviewed and are normal. She has no history of chronic renal disease or diabetes. Factors aggravating her hyperlipidemia include fatty foods. Pertinent negatives include no chest pain, leg pain or shortness of breath. Current antihyperlipidemic treatment includes statins. The current treatment provides moderate improvement of lipids. Risk factors for coronary artery disease include dyslipidemia, hypertension and obesity.  Back Pain This is a chronic problem. The current episode started more than 1 year ago. The problem occurs 2 to 4 times per day. The problem has been waxing and waning since onset. The pain is present in the lumbar spine. The quality of the pain is described as aching. The pain does not radiate. The pain is at a severity of 8/10. The pain is moderate. The pain is worse during the night. Pertinent negatives include no chest pain, leg pain or numbness. She has tried chiropractic manipulation and ice for the symptoms. The treatment provided  moderate relief.   *[place] and would like something other than the Tylenol for her back pain. Pt also states she just went to her chiropractor last week and they took her BP and it was 120s/80s.    Review of Systems  Eyes: Negative for blurred vision.  Respiratory: Negative for shortness of breath.   Cardiovascular: Negative for chest pain and palpitations.  Musculoskeletal: Positive for back pain.  Neurological: Negative for numbness.  All other systems reviewed and are negative.      Objective:   Physical Exam  Vitals reviewed. Constitutional: She is oriented to person, place, and time. She appears well-developed and well-nourished. No distress.  HENT:  Head: Normocephalic and atraumatic.  Right Ear: External ear normal.  Mouth/Throat: Oropharynx is clear and moist.  Eyes: Pupils are equal, round, and reactive to light.  Neck: Normal range of motion. Neck supple. No thyromegaly present.  Cardiovascular: Normal rate, regular rhythm, normal heart sounds and intact distal pulses.   No murmur heard. Pulmonary/Chest: Effort normal and breath sounds normal. No respiratory distress. She has no wheezes.  Abdominal: Soft. Bowel sounds are normal. She exhibits no distension. There is no tenderness.  Musculoskeletal: Normal range of motion. She exhibits no edema and no tenderness.  Neurological: She is alert and oriented to person, place, and time. She has normal reflexes. No cranial nerve deficit.  Skin: Skin is warm and dry.  Psychiatric: She has a normal mood and affect. Her behavior is normal. Judgment and thought content normal.     BP 150/72  Pulse 68  Temp(Src) 98.4 F (36.9 C) (Oral)  Ht $R'5\' 4"'GS$  (1.626 m)  Wt 162 lb 9.6 oz (73.755 kg)  BMI 27.90 kg/m2      Assessment & Plan:  1. HTN (hypertension) - amLODipine (NORVASC) 5 MG tablet; Take 1.5 tablets (7.5 mg total) by mouth daily.  Dispense: 45 tablet; Refill: 5 -  benazepril-hydrochlorthiazide (LOTENSIN HCT) 20-25 MG per tablet; Take 1 tablet by mouth daily.  Dispense: 30 tablet; Refill: 5 - BMP8+EGFR; Future  2. HLD (hyperlipidemia) - lovastatin (MEVACOR) 40 MG tablet; Take 1 tablet (40 mg total) by mouth at bedtime.  Dispense: 30 tablet; Refill: 5 - NMR, lipoprofile; Future  3. Unspecified vitamin D deficiency - Vit D  25 hydroxy (rtn osteoporosis monitoring); Future  4. Back pain, chronic  Meds ordered this encounter  Medications  . amLODipine (NORVASC) 5 MG tablet    Sig: Take 1.5 tablets (7.5 mg total) by mouth daily.    Dispense:  45 tablet    Refill:  5    Order Specific Question:  Supervising Provider    Answer:  Chipper Herb [[id number]]  . benazepril-hydrochlorthiazide (LOTENSIN HCT) 20-25 MG per tablet    Sig: Take 1 tablet by mouth daily.    Dispense:  30 tablet    Refill:  5    Order Specific Question:  Supervising Provider    Answer:  Chipper Herb [[id number]]  . lovastatin (MEVACOR) 40 MG tablet    Sig: Take 1 tablet (40 mg total) by mouth at bedtime.    Dispense:  30 tablet    Refill:  5    Order Specific Question:  Supervising Provider    Answer:  Chipper Herb [[id number]]  . meloxicam (MOBIC) 7.5 MG tablet    Sig: Take 1 tablet (7.5 mg total) by mouth daily.    Dispense:  30 tablet    Refill:  2    Order Specific Question:  Supervising Provider    Answer:  Joycelyn Man      Continue all meds Labs pending Health Maintenance reviewed Diet and exercise encouraged RTO 6 months  Evelina Dun, FNP

## 2014-01-08 ENCOUNTER — Other Ambulatory Visit (INDEPENDENT_AMBULATORY_CARE_PROVIDER_SITE_OTHER): Payer: Medicare Other

## 2014-01-08 DIAGNOSIS — I10 Essential (primary) hypertension: Secondary | ICD-10-CM

## 2014-01-08 DIAGNOSIS — E559 Vitamin D deficiency, unspecified: Secondary | ICD-10-CM

## 2014-01-08 DIAGNOSIS — E785 Hyperlipidemia, unspecified: Secondary | ICD-10-CM

## 2014-01-09 ENCOUNTER — Telehealth: Payer: Self-pay | Admitting: Family Medicine

## 2014-01-09 LAB — NMR, LIPOPROFILE
CHOLESTEROL: 150 mg/dL (ref 100–199)
HDL Cholesterol by NMR: 57 mg/dL (ref >39)
HDL PARTICLE NUMBER: 40.7 umol/L (ref >=30.5)
LDL Particle Number: 931 nmol/L (ref <1000)
LDL SIZE: 20.5 nm (ref >20.5)
LDLC SERPL CALC-MCNC: 71 mg/dL (ref 0–99)
LP-IR Score: 46 — ABNORMAL HIGH (ref <=45)
Small LDL Particle Number: 467 nmol/L (ref <=527)
Triglycerides by NMR: 108 mg/dL (ref 0–149)

## 2014-01-09 LAB — BMP8+EGFR
BUN / CREAT RATIO: 23 (ref 11–26)
BUN: 19 mg/dL (ref 8–27)
CHLORIDE: 104 mmol/L (ref 97–108)
CO2: 25 mmol/L (ref 18–29)
Calcium: 9.1 mg/dL (ref 8.7–10.3)
Creatinine, Ser: 0.83 mg/dL (ref 0.57–1.00)
GFR calc non Af Amer: 69 mL/min/{1.73_m2} (ref >59)
GFR, EST AFRICAN AMERICAN: 80 mL/min/{1.73_m2} (ref >59)
Glucose: 96 mg/dL (ref 65–99)
Potassium: 4.2 mmol/L (ref 3.5–5.2)
SODIUM: 144 mmol/L (ref 134–144)

## 2014-01-09 LAB — VITAMIN D 25 HYDROXY (VIT D DEFICIENCY, FRACTURES): Vit D, 25-Hydroxy: 36 ng/mL (ref 30.0–100.0)

## 2014-01-09 NOTE — Telephone Encounter (Signed)
Message copied by Waverly Ferrari on Wed Jan 09, 2014 10:20 AM ------      Message from: Lenna Gilford, [place] A      Created: Wed Jan 09, 2014  9:49 AM       Kidney function WNL      Vit D levels WNL, but low side of normal- Increase Vit D OTC 3000 units BID      Cholesterol levels WNL-Continue current meds- low fat diet and exercise and recheck in 3 months       ------

## 2014-01-09 NOTE — Telephone Encounter (Signed)
Pt aware of lab results 

## 2014-04-26 ENCOUNTER — Telehealth: Payer: Self-pay | Admitting: Nurse Practitioner

## 2014-04-26 NOTE — Telephone Encounter (Signed)
Needs to be seen for pain meds

## 2014-04-30 NOTE — Telephone Encounter (Signed)
Appt scheduled. Patient aware. 

## 2014-05-03 ENCOUNTER — Encounter: Payer: Self-pay | Admitting: Family

## 2014-05-03 ENCOUNTER — Ambulatory Visit (INDEPENDENT_AMBULATORY_CARE_PROVIDER_SITE_OTHER): Payer: Medicare Other | Admitting: Family

## 2014-05-03 VITALS — BP 169/86 | HR 73 | Temp 97.6°F | Ht 64.0 in | Wt 156.8 lb

## 2014-05-03 DIAGNOSIS — M545 Low back pain, unspecified: Secondary | ICD-10-CM

## 2014-05-03 MED ORDER — TRAMADOL HCL 50 MG PO TABS: 50.0000 mg | ORAL_TABLET | Freq: Three times a day (TID) | ORAL | Status: DC | PRN

## 2014-05-03 MED ORDER — CELECOXIB 100 MG PO CAPS: 100.0000 mg | ORAL_CAPSULE | Freq: Two times a day (BID) | ORAL | Status: DC

## 2014-05-03 MED ORDER — KETOROLAC TROMETHAMINE 30 MG/ML IJ SOLN
30.0000 mg | Freq: Once | INTRAMUSCULAR | Status: AC
  Administered 2014-05-03: 30 mg via INTRAMUSCULAR

## 2014-05-03 NOTE — Patient Instructions (Signed)
Back Pain, Adult Low back pain is very common. About 1 in 5 people have back pain.The cause of low back pain is rarely dangerous. The pain often gets better over time.About half of people with a sudden onset of back pain feel better in just 2 weeks. About 8 in 10 people feel better by 6 weeks.  CAUSES Some common causes of back pain include:  Strain of the muscles or ligaments supporting the spine.  Wear and tear (degeneration) of the spinal discs.  Arthritis.  Direct injury to the back. DIAGNOSIS Most of the time, the direct cause of low back pain is not known.However, back pain can be treated effectively even when the exact cause of the pain is unknown.Answering your caregiver's questions about your overall health and symptoms is one of the most accurate ways to make sure the cause of your pain is not dangerous. If your caregiver needs more information, he or she may order lab work or imaging tests (X-rays or MRIs).However, even if imaging tests show changes in your back, this usually does not require surgery. HOME CARE INSTRUCTIONS For many people, back pain returns.Since low back pain is rarely dangerous, it is often a condition that people can learn to manageon their own.   Remain active. It is stressful on the back to sit or stand in one place. Do not sit, drive, or stand in one place for more than 30 minutes at a time. Take short walks on level surfaces as soon as pain allows.Try to increase the length of time you walk each day.  Do not stay in bed.Resting more than 1 or 2 days can delay your recovery.  Do not avoid exercise or work.Your body is made to move.It is not dangerous to be active, even though your back may hurt.Your back will likely heal faster if you return to being active before your pain is gone.  Pay attention to your body when you bend and lift. Many people have less discomfortwhen lifting if they bend their knees, keep the load close to their bodies,and  avoid twisting. Often, the most comfortable positions are those that put less stress on your recovering back.  Find a comfortable position to sleep. Use a firm mattress and lie on your side with your knees slightly bent. If you lie on your back, put a pillow under your knees.  Only take over-the-counter or prescription medicines as directed by your caregiver. Over-the-counter medicines to reduce pain and inflammation are often the most helpful.Your caregiver may prescribe muscle relaxant drugs.These medicines help dull your pain so you can more quickly return to your normal activities and healthy exercise.  Put ice on the injured area.  Put ice in a plastic bag.  Place a towel between your skin and the bag.  Leave the ice on for 15-20 minutes, 03-04 times a day for the first 2 to 3 days. After that, ice and heat may be alternated to reduce pain and spasms.  Ask your caregiver about trying back exercises and gentle massage. This may be of some benefit.  Avoid feeling anxious or stressed.Stress increases muscle tension and can worsen back pain.It is important to recognize when you are anxious or stressed and learn ways to manage it.Exercise is a great option. SEEK MEDICAL CARE IF:  You have pain that is not relieved with rest or medicine.  You have pain that does not improve in 1 week.  You have new symptoms.  You are generally not feeling well. SEEK   IMMEDIATE MEDICAL CARE IF:   You have pain that radiates from your back into your legs.  You develop new bowel or bladder control problems.  You have unusual weakness or numbness in your arms or legs.  You develop nausea or vomiting.  You develop abdominal pain.  You feel faint. Document Released: 08/09/2005 Document Revised: 02/08/2012 Document Reviewed: 12/11/2013 [employer] Patient Information 2015 [employer]. This information is not intended to replace advice given to you by your health care provider. Make sure you  discuss any questions you have with your health care provider.  

## 2014-05-03 NOTE — Progress Notes (Signed)
   Subjective:    Patient ID: Belinda Scott, female    DOB: July 14, 1941, 73 y.o.   MRN: [id number]  Back Pain This is a recurrent problem. The current episode started more than 1 year ago. The problem occurs constantly. The problem is unchanged. The pain is present in the gluteal and lumbar spine. The quality of the pain is described as aching and cramping. The pain does not radiate. The pain is at a severity of 9/10. The pain is moderate. The pain is worse during the day. The symptoms are aggravated by standing and sitting. Pertinent negatives include no abdominal pain, bladder incontinence, bowel incontinence, dysuria, headaches, leg pain, numbness or pelvic pain. She has tried NSAIDs and ice for the symptoms. The treatment provided mild relief.      Review of Systems  Constitutional: Negative.   HENT: Negative.   Eyes: Negative.   Respiratory: Negative.  Negative for shortness of breath.   Cardiovascular: Negative.  Negative for palpitations.  Gastrointestinal: Negative.  Negative for abdominal pain and bowel incontinence.  Endocrine: Negative.   Genitourinary: Negative.  Negative for bladder incontinence, dysuria and pelvic pain.  Musculoskeletal: Positive for back pain.  Neurological: Negative.  Negative for numbness and headaches.  Hematological: Negative.   Psychiatric/Behavioral: Negative.   All other systems reviewed and are negative.      Objective:   Physical Exam  Vitals reviewed. Constitutional: She is oriented to person, place, and time. She appears well-developed and well-nourished. No distress.  HENT:  Head: Normocephalic and atraumatic.  Right Ear: External ear normal.  Mouth/Throat: Oropharynx is clear and moist.  Eyes: Pupils are equal, round, and reactive to light.  Neck: Normal range of motion. Neck supple. No thyromegaly present.  Cardiovascular: Normal rate, regular rhythm, normal heart sounds and intact distal pulses.   No murmur heard. Pulmonary/Chest:  Effort normal and breath sounds normal. No respiratory distress. She has no wheezes.  Abdominal: Soft. Bowel sounds are normal. She exhibits no distension. There is no tenderness.  Musculoskeletal: Normal range of motion. She exhibits no edema and no tenderness.  Neurological: She is alert and oriented to person, place, and time. She has normal reflexes. No cranial nerve deficit.  Skin: Skin is warm and dry.  Psychiatric: She has a normal mood and affect. Her behavior is normal. Judgment and thought content normal.    BP 169/86  Pulse 73  Temp(Src) 97.6 F (36.4 C) (Oral)  Ht $R'5\' 4"'ap$  (1.626 m)  Wt 156 lb 12.8 oz (71.124 kg)  BMI 26.90 kg/m2       Assessment & Plan:  1. Bilateral low back pain without sciatica -Rest -Ice - ketorolac (TORADOL) 30 MG/ML injection 30 mg; Inject 1 mL (30 mg total) into the muscle once. - traMADol (ULTRAM) 50 MG tablet; Take 1 tablet (50 mg total) by mouth every 8 (eight) hours as needed.  Dispense: 30 tablet; Refill: 0 - celecoxib (CELEBREX) 100 MG capsule; Take 1 capsule (100 mg total) by mouth 2 (two) times daily.  Dispense: 60 capsule; Refill: 2 - BMP8+EGFR  Evelina Dun, FNP

## 2014-05-04 LAB — BMP8+EGFR
BUN/Creatinine Ratio: 17 (ref 11–26)
BUN: 14 mg/dL (ref 8–27)
CO2: 27 mmol/L (ref 18–29)
CREATININE: 0.83 mg/dL (ref 0.57–1.00)
Calcium: 9.4 mg/dL (ref 8.7–10.3)
Chloride: 100 mmol/L (ref 97–108)
GFR calc Af Amer: 80 mL/min/{1.73_m2} (ref >59)
GFR, EST NON AFRICAN AMERICAN: 69 mL/min/{1.73_m2} (ref >59)
Glucose: 92 mg/dL (ref 65–99)
Potassium: 4 mmol/L (ref 3.5–5.2)
SODIUM: 143 mmol/L (ref 134–144)

## 2014-05-07 ENCOUNTER — Telehealth: Payer: Self-pay | Admitting: *Deleted

## 2014-05-07 NOTE — Telephone Encounter (Signed)
Pt notified of results Verbalizes understanding 

## 2014-05-07 NOTE — Telephone Encounter (Signed)
Message copied by Marin Olp on Tue May 07, 2014  9:43 AM ------      Message from: Lenna Gilford, [place] A      Created: Mon May 06, 2014 11:43 AM       Kidney and liver function stable       ------

## 2014-06-04 ENCOUNTER — Other Ambulatory Visit: Payer: Self-pay | Admitting: Family

## 2014-06-05 ENCOUNTER — Telehealth: Payer: Self-pay | Admitting: Family

## 2014-06-05 DIAGNOSIS — M545 Low back pain, unspecified: Secondary | ICD-10-CM

## 2014-06-05 MED ORDER — TRAMADOL HCL 50 MG PO TABS: 50.0000 mg | ORAL_TABLET | Freq: Three times a day (TID) | ORAL | Status: DC | PRN

## 2014-06-05 NOTE — Telephone Encounter (Signed)
Patient aware to pick up 

## 2014-07-03 ENCOUNTER — Other Ambulatory Visit: Payer: Self-pay | Admitting: Family

## 2014-07-03 DIAGNOSIS — M545 Low back pain, unspecified: Secondary | ICD-10-CM

## 2014-07-04 ENCOUNTER — Other Ambulatory Visit: Payer: Self-pay | Admitting: *Deleted

## 2014-07-04 MED ORDER — TRAMADOL HCL 50 MG PO TABS: 50.0000 mg | ORAL_TABLET | Freq: Three times a day (TID) | ORAL | Status: DC | PRN

## 2014-07-04 NOTE — Telephone Encounter (Signed)
Aware,ultram ready.

## 2014-07-04 NOTE — Telephone Encounter (Signed)
Last filled 06/05/14, last seen by [facility] 05/03/14. Rx will print

## 2014-07-04 NOTE — Telephone Encounter (Signed)
Ultram rx ready for pick up  

## 2014-08-01 ENCOUNTER — Other Ambulatory Visit: Payer: Self-pay | Admitting: Family

## 2014-08-01 DIAGNOSIS — M545 Low back pain, unspecified: Secondary | ICD-10-CM

## 2014-08-01 MED ORDER — TRAMADOL HCL 50 MG PO TABS: 50.0000 mg | ORAL_TABLET | Freq: Three times a day (TID) | ORAL | Status: DC | PRN

## 2014-08-01 NOTE — Telephone Encounter (Signed)
Ultram rx ready for pick up  

## 2014-08-01 NOTE — Telephone Encounter (Signed)
Last seen by [facility] on 05/03/14, last filled 07/04/14.

## 2014-08-14 ENCOUNTER — Other Ambulatory Visit: Payer: Self-pay | Admitting: Family

## 2014-09-20 ENCOUNTER — Other Ambulatory Visit: Payer: Self-pay | Admitting: Family

## 2014-09-20 ENCOUNTER — Other Ambulatory Visit: Payer: Self-pay | Admitting: Family Medicine

## 2014-09-23 DIAGNOSIS — M5430 Sciatica, unspecified side: Secondary | ICD-10-CM | POA: Diagnosis not present

## 2014-09-23 DIAGNOSIS — M9904 Segmental and somatic dysfunction of sacral region: Secondary | ICD-10-CM | POA: Diagnosis not present

## 2014-09-23 DIAGNOSIS — M9903 Segmental and somatic dysfunction of lumbar region: Secondary | ICD-10-CM | POA: Diagnosis not present

## 2014-09-23 DIAGNOSIS — M9902 Segmental and somatic dysfunction of thoracic region: Secondary | ICD-10-CM | POA: Diagnosis not present

## 2014-09-23 DIAGNOSIS — I973 Postprocedural hypertension: Secondary | ICD-10-CM | POA: Diagnosis not present

## 2014-10-01 DIAGNOSIS — M9904 Segmental and somatic dysfunction of sacral region: Secondary | ICD-10-CM | POA: Diagnosis not present

## 2014-10-01 DIAGNOSIS — M5430 Sciatica, unspecified side: Secondary | ICD-10-CM | POA: Diagnosis not present

## 2014-10-01 DIAGNOSIS — M9902 Segmental and somatic dysfunction of thoracic region: Secondary | ICD-10-CM | POA: Diagnosis not present

## 2014-10-01 DIAGNOSIS — M9903 Segmental and somatic dysfunction of lumbar region: Secondary | ICD-10-CM | POA: Diagnosis not present

## 2014-10-01 DIAGNOSIS — I973 Postprocedural hypertension: Secondary | ICD-10-CM | POA: Diagnosis not present

## 2014-10-04 ENCOUNTER — Encounter: Payer: Self-pay | Admitting: Family

## 2014-10-04 ENCOUNTER — Ambulatory Visit (INDEPENDENT_AMBULATORY_CARE_PROVIDER_SITE_OTHER): Payer: Medicare Other | Admitting: Family

## 2014-10-04 VITALS — BP 180/92 | HR 69 | Temp 97.9°F | Ht 64.0 in | Wt 160.6 lb

## 2014-10-04 DIAGNOSIS — E785 Hyperlipidemia, unspecified: Secondary | ICD-10-CM

## 2014-10-04 DIAGNOSIS — E559 Vitamin D deficiency, unspecified: Secondary | ICD-10-CM | POA: Diagnosis not present

## 2014-10-04 DIAGNOSIS — M545 Low back pain, unspecified: Secondary | ICD-10-CM

## 2014-10-04 DIAGNOSIS — M549 Dorsalgia, unspecified: Secondary | ICD-10-CM

## 2014-10-04 DIAGNOSIS — I10 Essential (primary) hypertension: Secondary | ICD-10-CM | POA: Diagnosis not present

## 2014-10-04 DIAGNOSIS — G8929 Other chronic pain: Secondary | ICD-10-CM

## 2014-10-04 MED ORDER — TRAMADOL HCL 50 MG PO TABS: 50.0000 mg | ORAL_TABLET | Freq: Three times a day (TID) | ORAL | Status: DC | PRN

## 2014-10-04 MED ORDER — METOPROLOL SUCCINATE ER 50 MG PO TB24: 50.0000 mg | ORAL_TABLET | Freq: Every day | ORAL | Status: DC

## 2014-10-04 MED ORDER — LOVASTATIN 40 MG PO TABS: ORAL_TABLET | ORAL | Status: DC

## 2014-10-04 MED ORDER — BENAZEPRIL-HYDROCHLOROTHIAZIDE 20-25 MG PO TABS: 1.0000 | ORAL_TABLET | Freq: Every day | ORAL | Status: DC

## 2014-10-04 NOTE — Patient Instructions (Signed)

## 2014-10-04 NOTE — Progress Notes (Signed)
Subjective:    Patient ID: Belinda Scott, female    DOB: 1941/06/07, 74 y.o.   MRN: [id number]  Hyperlipidemia This is a chronic problem. The current episode started more than 1 year ago. The problem is controlled. Recent lipid tests were reviewed and are normal. She has no history of chronic renal disease or diabetes. Factors aggravating her hyperlipidemia include fatty foods. Pertinent negatives include no chest pain, leg pain or shortness of breath. Current antihyperlipidemic treatment includes statins. The current treatment provides moderate improvement of lipids. Risk factors for coronary artery disease include dyslipidemia, hypertension and obesity.  Hypertension This is a chronic problem. The current episode started more than 1 year ago. The problem has been waxing and waning since onset. The problem is uncontrolled. Pertinent negatives include no anxiety, blurred vision, chest pain, headaches, palpitations, peripheral edema or shortness of breath. Risk factors for coronary artery disease include dyslipidemia and post-menopausal state. Past treatments include diuretics and ACE inhibitors. Improvement on treatment: Pt states she has not been taking norvasc. Compliance problems include medication side effects.  There is no history of kidney disease, CVA, heart failure, retinopathy or a thyroid problem. There is no history of chronic renal disease or sleep apnea.  Back Pain This is a chronic problem. The current episode started more than 1 year ago. The problem occurs constantly. The problem has been waxing and waning since onset. The pain is present in the lumbar spine. The pain is moderate. Pertinent negatives include no chest pain, headaches, leg pain or numbness. She has tried analgesics for the symptoms. The treatment provided moderate relief.      Review of Systems  Constitutional: Negative.   HENT: Negative.   Eyes: Negative.  Negative for blurred vision.  Respiratory: Negative.   Negative for shortness of breath.   Cardiovascular: Negative.  Negative for chest pain and palpitations.  Gastrointestinal: Negative.   Endocrine: Negative.   Genitourinary: Negative.   Musculoskeletal: Positive for back pain.  Neurological: Negative.  Negative for numbness and headaches.  Hematological: Negative.   Psychiatric/Behavioral: Negative.   All other systems reviewed and are negative.      Objective:   Physical Exam  Constitutional: She is oriented to person, place, and time. She appears well-developed and well-nourished. No distress.  HENT:  Head: Normocephalic and atraumatic.  Right Ear: External ear normal.  Mouth/Throat: Oropharynx is clear and moist.  Eyes: Pupils are equal, round, and reactive to light.  Neck: Normal range of motion. Neck supple. No thyromegaly present.  Cardiovascular: Normal rate, regular rhythm, normal heart sounds and intact distal pulses.   No murmur heard. Pulmonary/Chest: Effort normal and breath sounds normal. No respiratory distress. She has no wheezes.  Abdominal: Soft. Bowel sounds are normal. She exhibits no distension. There is no tenderness.  Musculoskeletal: Normal range of motion. She exhibits no edema or tenderness.  Neurological: She is alert and oriented to person, place, and time. She has normal reflexes. No cranial nerve deficit.  Skin: Skin is warm and dry.  Psychiatric: She has a normal mood and affect. Her behavior is normal. Judgment and thought content normal.  Vitals reviewed.  Ht $R'5\' 4"'yc$  (1.626 m)  Wt 160 lb 9.6 oz (72.848 kg)  BMI 27.55 kg/m2        Assessment & Plan:  1. Essential hypertension Pt started on metoprolol today -Daily blood pressure log given with instructions on how to fill out and told to bring to next visit -Dash diet information given -Exercise  encouraged - Stress Management  -Continue current meds -RTO in 2 weeks  - CMP14+EGFR; Future - metoprolol succinate (TOPROL-XL) 50 MG 24 hr tablet;  Take 1 tablet (50 mg total) by mouth daily. Take with or immediately following a meal.  Dispense: 90 tablet; Refill: 3 - benazepril-hydrochlorthiazide (LOTENSIN HCT) 20-25 MG per tablet; Take 1 tablet by mouth daily.  Dispense: 90 tablet; Refill: 3  2. HLD (hyperlipidemia) - CMP14+EGFR; Future - Lipid panel; Future - lovastatin (MEVACOR) 40 MG tablet; TAKE ONE TABLET BY MOUTH ONCE DAILY AT BEDTIME. NEED TO BE SEEN FOR REFILLS  Dispense: 90 tablet; Refill: 3    4. Vitamin D deficiency  - CMP14+EGFR; Future - Vit D  25 hydroxy (rtn osteoporosis monitoring); Future  5. Back pain, chronic - CMP14+EGFR; Future - Ambulatory referral to Orthopedic Surgery  6. Bilateral low back pain without sciatica - CMP14+EGFR; Future - traMADol (ULTRAM) 50 MG tablet; Take 1 tablet (50 mg total) by mouth every 8 (eight) hours as needed.  Dispense: 60 tablet; Refill: 0 - Ambulatory referral to Orthopedic Surgery   Continue all meds Labs ordered- Pt ate before visit Health Maintenance reviewed Diet and exercise encouraged RTO 2 weeks  Evelina Dun, FNP

## 2014-10-09 ENCOUNTER — Telehealth: Payer: Self-pay | Admitting: Family

## 2014-10-09 NOTE — Telephone Encounter (Signed)
Patient called wanting to know if [facility] ortho came here once a month. Advised patient that they do. She stated that she has been told by her chiropractor that she needs a cortisone shot in her back and wanted to know if they can do that here. Advised patient when [place] [facility] calls to set up her appt that she needs to ask if what she needs done can be done here at our office of if she needs to come out to [place] to their office. Patient verbalized understanding.   Patient returned call to office again and stated that she spoke to chiropractic office and they told her to schedule appt here first and go from there. Appt made per patient request

## 2014-10-15 ENCOUNTER — Encounter: Payer: Self-pay | Admitting: Family

## 2014-10-15 ENCOUNTER — Ambulatory Visit (INDEPENDENT_AMBULATORY_CARE_PROVIDER_SITE_OTHER): Payer: Medicare Other | Admitting: Family

## 2014-10-15 VITALS — BP 140/60 | HR 73 | Temp 97.2°F | Ht 64.0 in | Wt 160.0 lb

## 2014-10-15 DIAGNOSIS — G8929 Other chronic pain: Secondary | ICD-10-CM | POA: Diagnosis not present

## 2014-10-15 DIAGNOSIS — M25511 Pain in right shoulder: Secondary | ICD-10-CM

## 2014-10-15 MED ORDER — BUPIVACAINE HCL 0.25 % IJ SOLN
1.0000 mL | Freq: Once | INTRAMUSCULAR | Status: AC
  Administered 2014-10-15: 1 mL via INTRA_ARTICULAR

## 2014-10-15 MED ORDER — METHYLPREDNISOLONE ACETATE 40 MG/ML IJ SUSP
40.0000 mg | Freq: Once | INTRAMUSCULAR | Status: AC
  Administered 2014-10-15: 40 mg via INTRA_ARTICULAR

## 2014-10-15 NOTE — Progress Notes (Signed)
   Subjective:    Patient ID: Belinda Scott, female    DOB: 1941-08-13, 74 y.o.   MRN: [id number]  Pt presents to the office for  Back Pain Pertinent negatives include no headaches, numbness or tingling.  Shoulder Pain  The pain is present in the right shoulder. This is a chronic problem. The current episode started more than 1 year ago. There has been no history of extremity trauma. The problem occurs intermittently. The problem has been waxing and waning. The quality of the pain is described as aching. The pain is at a severity of 10/10. The pain is mild. Associated symptoms include a limited range of motion and stiffness. Pertinent negatives include no inability to bear weight, joint swelling, numbness or tingling. The symptoms are aggravated by activity. She has tried oral narcotics and rest for the symptoms. The treatment provided mild relief. Family history does not include gout or rheumatoid arthritis. There is no history of diabetes.      Review of Systems  Constitutional: Negative.   HENT: Negative.   Eyes: Negative.   Respiratory: Negative.  Negative for shortness of breath.   Cardiovascular: Negative.  Negative for palpitations.  Gastrointestinal: Negative.   Endocrine: Negative.   Genitourinary: Negative.   Musculoskeletal: Positive for back pain and stiffness.  Neurological: Negative.  Negative for tingling, numbness and headaches.  Hematological: Negative.   Psychiatric/Behavioral: Negative.   All other systems reviewed and are negative.      Objective:   Physical Exam  Constitutional: She is oriented to person, place, and time. She appears well-developed and well-nourished. No distress.  HENT:  Head: Normocephalic and atraumatic.  Right Ear: External ear normal.  Mouth/Throat: Oropharynx is clear and moist.  Eyes: Pupils are equal, round, and reactive to light.  Neck: Normal range of motion. Neck supple. No thyromegaly present.  Cardiovascular: Normal rate,  regular rhythm, normal heart sounds and intact distal pulses.   No murmur heard. Pulmonary/Chest: Effort normal and breath sounds normal. No respiratory distress. She has no wheezes.  Abdominal: Soft. Bowel sounds are normal. She exhibits no distension. There is no tenderness.  Musculoskeletal: She exhibits no edema or tenderness.  Neurological: She is alert and oriented to person, place, and time. She has normal reflexes. No cranial nerve deficit.  Skin: Skin is warm and dry.  Psychiatric: She has a normal mood and affect. Her behavior is normal. Judgment and thought content normal.  Vitals reviewed.    BP 140/60 mmHg  Pulse 73  Temp(Src) 97.2 F (36.2 C) (Oral)  Ht 5\' 4"  (1.626 m)  Wt 160 lb (72.576 kg)  BMI 27.45 kg/m2      Assessment & Plan:  1. Chronic shoulder pain, right -Rest -Ice as needed -RTO prn - bupivacaine (MARCAINE) 0.25 % (with pres) injection 1 mL; Inject 1 mL into the articular space once. - methylPREDNISolone acetate (DEPO-MEDROL) injection 40 mg; Inject 1 mL (40 mg total) into the articular space once.  Evelina Dun, FNP

## 2014-10-15 NOTE — Patient Instructions (Signed)
Joint Injection  Care After  Refer to this sheet in the next few days. These instructions provide you with information on caring for yourself after you have had a joint injection. Your caregiver also may give you more specific instructions. Your treatment has been planned according to current medical practices, but problems sometimes occur. Call your caregiver if you have any problems or questions after your procedure.  After any type of joint injection, it is not uncommon to experience:  · Soreness, swelling, or bruising around the injection site.  · Mild numbness, tingling, or weakness around the injection site caused by the numbing medicine used before or with the injection.  It also is possible to experience the following effects associated with the specific agent after injection:  · Iodine-based contrast agents:  ¨ Allergic reaction (itching, hives, widespread redness, and swelling beyond the injection site).  · Corticosteroids (These effects are rare.):  ¨ Allergic reaction.  ¨ Increased blood sugar levels (If you have diabetes and you notice that your blood sugar levels have increased, notify your caregiver).  ¨ Increased blood pressure levels.  ¨ Mood swings.  · Hyaluronic acid in the use of viscosupplementation.  ¨ Temporary heat or redness.  ¨ Temporary rash and itching.  ¨ Increased fluid accumulation in the injected joint.  These effects all should resolve within a day after your procedure.   HOME CARE INSTRUCTIONS  · Limit yourself to light activity the day of your procedure. Avoid lifting heavy objects, bending, stooping, or twisting.  · Take prescription or over-the-counter pain medication as directed by your caregiver.  · You may apply ice to your injection site to reduce pain and swelling the day of your procedure. Ice may be applied 03-04 times:  ¨ Put ice in a plastic bag.  ¨ Place a towel between your skin and the bag.  ¨ Leave the ice on for no longer than 15-20 minutes each time.  SEEK  IMMEDIATE MEDICAL CARE IF:   · Pain and swelling get worse rather than better or extend beyond the injection site.  · Numbness does not go away.  · Blood or fluid continues to leak from the injection site.  · You have chest pain.  · You have swelling of your face or tongue.  · You have trouble breathing or you become dizzy.  · You develop a fever, chills, or severe tenderness at the injection site that last longer than 1 day.  MAKE SURE YOU:  · Understand these instructions.  · Watch your condition.  · Get help right away if you are not doing well or if you get worse.  Document Released: 04/22/2011 Document Revised: 11/01/2011 Document Reviewed: 04/22/2011  Exit[employer]® Patient Information ©2015 [employer]. This information is not intended to replace advice given to you by your health care provider. Make sure you discuss any questions you have with your health care provider.

## 2014-10-22 DIAGNOSIS — M9903 Segmental and somatic dysfunction of lumbar region: Secondary | ICD-10-CM | POA: Diagnosis not present

## 2014-10-22 DIAGNOSIS — M9904 Segmental and somatic dysfunction of sacral region: Secondary | ICD-10-CM | POA: Diagnosis not present

## 2014-10-22 DIAGNOSIS — I973 Postprocedural hypertension: Secondary | ICD-10-CM | POA: Diagnosis not present

## 2014-10-22 DIAGNOSIS — M9902 Segmental and somatic dysfunction of thoracic region: Secondary | ICD-10-CM | POA: Diagnosis not present

## 2014-10-22 DIAGNOSIS — M5430 Sciatica, unspecified side: Secondary | ICD-10-CM | POA: Diagnosis not present

## 2014-11-11 ENCOUNTER — Telehealth: Payer: Self-pay | Admitting: Family

## 2014-11-11 DIAGNOSIS — M25511 Pain in right shoulder: Secondary | ICD-10-CM

## 2014-11-11 NOTE — Telephone Encounter (Signed)
Referral sent for orthopedics

## 2014-11-28 DIAGNOSIS — M545 Low back pain: Secondary | ICD-10-CM | POA: Diagnosis not present

## 2014-12-02 ENCOUNTER — Telehealth: Payer: Self-pay | Admitting: Family

## 2014-12-02 ENCOUNTER — Other Ambulatory Visit (INDEPENDENT_AMBULATORY_CARE_PROVIDER_SITE_OTHER): Payer: Medicare Other

## 2014-12-02 DIAGNOSIS — M549 Dorsalgia, unspecified: Secondary | ICD-10-CM | POA: Diagnosis not present

## 2014-12-02 DIAGNOSIS — I10 Essential (primary) hypertension: Secondary | ICD-10-CM | POA: Diagnosis not present

## 2014-12-02 DIAGNOSIS — M545 Low back pain, unspecified: Secondary | ICD-10-CM

## 2014-12-02 DIAGNOSIS — G8929 Other chronic pain: Secondary | ICD-10-CM | POA: Diagnosis not present

## 2014-12-02 DIAGNOSIS — E559 Vitamin D deficiency, unspecified: Secondary | ICD-10-CM

## 2014-12-02 DIAGNOSIS — E785 Hyperlipidemia, unspecified: Secondary | ICD-10-CM | POA: Diagnosis not present

## 2014-12-02 MED ORDER — TRAMADOL HCL 50 MG PO TABS: 50.0000 mg | ORAL_TABLET | Freq: Three times a day (TID) | ORAL | Status: DC | PRN

## 2014-12-02 NOTE — Progress Notes (Signed)
Lab only 

## 2014-12-02 NOTE — Telephone Encounter (Signed)
RX ready for pick up 

## 2014-12-03 DIAGNOSIS — M9902 Segmental and somatic dysfunction of thoracic region: Secondary | ICD-10-CM | POA: Diagnosis not present

## 2014-12-03 DIAGNOSIS — M5430 Sciatica, unspecified side: Secondary | ICD-10-CM | POA: Diagnosis not present

## 2014-12-03 DIAGNOSIS — M9903 Segmental and somatic dysfunction of lumbar region: Secondary | ICD-10-CM | POA: Diagnosis not present

## 2014-12-03 DIAGNOSIS — M9904 Segmental and somatic dysfunction of sacral region: Secondary | ICD-10-CM | POA: Diagnosis not present

## 2014-12-03 DIAGNOSIS — I973 Postprocedural hypertension: Secondary | ICD-10-CM | POA: Diagnosis not present

## 2014-12-03 LAB — LIPID PANEL
CHOLESTEROL TOTAL: 160 mg/dL (ref 100–199)
Chol/HDL Ratio: 2.5 ratio units (ref 0.0–4.4)
HDL: 63 mg/dL (ref >39)
LDL Calculated: 76 mg/dL (ref 0–99)
TRIGLYCERIDES: 105 mg/dL (ref 0–149)
VLDL Cholesterol Cal: 21 mg/dL (ref 5–40)

## 2014-12-03 LAB — CMP14+EGFR
ALBUMIN: 4.5 g/dL (ref 3.5–4.8)
ALK PHOS: 67 IU/L (ref 39–117)
ALT: 16 IU/L (ref 0–32)
AST: 24 IU/L (ref 0–40)
Albumin/Globulin Ratio: 1.5 (ref 1.1–2.5)
BUN / CREAT RATIO: 23 (ref 11–26)
BUN: 19 mg/dL (ref 8–27)
Bilirubin Total: 0.7 mg/dL (ref 0.0–1.2)
CO2: 27 mmol/L (ref 18–29)
Calcium: 9.4 mg/dL (ref 8.7–10.3)
Chloride: 102 mmol/L (ref 97–108)
Creatinine, Ser: 0.81 mg/dL (ref 0.57–1.00)
GFR calc Af Amer: 82 mL/min/{1.73_m2} (ref >59)
GFR calc non Af Amer: 71 mL/min/{1.73_m2} (ref >59)
Globulin, Total: 3 g/dL (ref 1.5–4.5)
Glucose: 104 mg/dL — ABNORMAL HIGH (ref 65–99)
Potassium: 4.5 mmol/L (ref 3.5–5.2)
Sodium: 145 mmol/L — ABNORMAL HIGH (ref 134–144)
Total Protein: 7.5 g/dL (ref 6.0–8.5)

## 2014-12-03 LAB — VITAMIN D 25 HYDROXY (VIT D DEFICIENCY, FRACTURES): Vit D, 25-Hydroxy: 46.1 ng/mL (ref 30.0–100.0)

## 2014-12-31 DIAGNOSIS — M9903 Segmental and somatic dysfunction of lumbar region: Secondary | ICD-10-CM | POA: Diagnosis not present

## 2014-12-31 DIAGNOSIS — M5137 Other intervertebral disc degeneration, lumbosacral region: Secondary | ICD-10-CM | POA: Diagnosis not present

## 2014-12-31 DIAGNOSIS — M9902 Segmental and somatic dysfunction of thoracic region: Secondary | ICD-10-CM | POA: Diagnosis not present

## 2014-12-31 DIAGNOSIS — M9901 Segmental and somatic dysfunction of cervical region: Secondary | ICD-10-CM | POA: Diagnosis not present

## 2015-01-28 ENCOUNTER — Encounter (INDEPENDENT_AMBULATORY_CARE_PROVIDER_SITE_OTHER): Payer: Self-pay

## 2015-01-28 ENCOUNTER — Ambulatory Visit (INDEPENDENT_AMBULATORY_CARE_PROVIDER_SITE_OTHER): Payer: Medicare Other | Admitting: Family

## 2015-01-28 ENCOUNTER — Encounter: Payer: Self-pay | Admitting: Family

## 2015-01-28 VITALS — BP 159/75 | HR 66 | Temp 98.9°F | Ht 64.0 in | Wt 157.2 lb

## 2015-01-28 DIAGNOSIS — M545 Low back pain, unspecified: Secondary | ICD-10-CM

## 2015-01-28 DIAGNOSIS — G8929 Other chronic pain: Secondary | ICD-10-CM

## 2015-01-28 DIAGNOSIS — M25511 Pain in right shoulder: Secondary | ICD-10-CM

## 2015-01-28 MED ORDER — TRAMADOL HCL 50 MG PO TABS: 50.0000 mg | ORAL_TABLET | Freq: Three times a day (TID) | ORAL | Status: DC | PRN

## 2015-01-28 MED ORDER — LIDOCAINE HCL 1 % IJ SOLN
1.0000 mL | Freq: Once | INTRAMUSCULAR | Status: AC
  Administered 2015-01-28: 1 mL

## 2015-01-28 MED ORDER — METHYLPREDNISOLONE ACETATE 40 MG/ML IJ SUSP
40.0000 mg | Freq: Once | INTRAMUSCULAR | Status: AC
  Administered 2015-01-28: 40 mg via INTRA_ARTICULAR

## 2015-01-28 NOTE — Progress Notes (Signed)
   Subjective:    Patient ID: Belinda Scott, female    DOB: [birth year], 74 y.o.   MRN: [id number]  Pt presents for right shoulder injection.  Shoulder Pain  The pain is present in the right shoulder. This is a chronic problem. The current episode started more than 1 year ago. There has been no history of extremity trauma. The problem occurs intermittently. The problem has been unchanged. The quality of the pain is described as aching. The pain is at a severity of 8/10. The pain is moderate. Associated symptoms include stiffness. Pertinent negatives include no inability to bear weight, itching, limited range of motion, numbness or tingling. The symptoms are aggravated by activity. She has tried rest and oral narcotics for the symptoms. The treatment provided moderate relief.      Review of Systems  Constitutional: Negative.   HENT: Negative.   Eyes: Negative.   Respiratory: Negative.  Negative for shortness of breath.   Cardiovascular: Negative.  Negative for palpitations.  Gastrointestinal: Negative.   Endocrine: Negative.   Genitourinary: Negative.   Musculoskeletal: Positive for stiffness.  Skin: Negative for itching.  Neurological: Negative.  Negative for tingling, numbness and headaches.  Hematological: Negative.   Psychiatric/Behavioral: Negative.   All other systems reviewed and are negative.      Objective:   Physical Exam  Constitutional: She is oriented to person, place, and time. She appears well-developed and well-nourished. No distress.  HENT:  Head: Normocephalic and atraumatic.  Eyes: Pupils are equal, round, and reactive to light.  Neck: Normal range of motion. Neck supple. No thyromegaly present.  Cardiovascular: Normal rate, regular rhythm, normal heart sounds and intact distal pulses.   No murmur heard. Pulmonary/Chest: Effort normal and breath sounds normal. No respiratory distress. She has no wheezes.  Abdominal: Soft. Bowel sounds are normal. She exhibits  no distension. There is no tenderness.  Musculoskeletal: Normal range of motion. She exhibits no edema or tenderness.  Neurological: She is alert and oriented to person, place, and time. She has normal reflexes. No cranial nerve deficit.  Skin: Skin is warm and dry.  Psychiatric: She has a normal mood and affect. Her behavior is normal. Judgment and thought content normal.  Vitals reviewed.   BP 159/75 mmHg  Pulse 66  Temp(Src) 98.9 F (37.2 C) (Oral)  Ht 5\' 4"  (1.626 m)  Wt 157 lb 3.2 oz (71.305 kg)  BMI 26.97 kg/m2  Procedure note: Area cleaned with chlorascrub With patient siting with arm resting at her side Depo-Medrol and Lidocaine injected posterior in AC joint Band-Aid applied     Assessment & Plan:  1. Chronic shoulder pain, right -Take pain meds as prescribed -S/S of infection discussed -RTO prn - methylPREDNISolone acetate (DEPO-MEDROL) injection 40 mg; Inject 1 mL (40 mg total) into the articular space once. - lidocaine (XYLOCAINE) 1 % (with pres) injection 1 mL; 1 mL by Other route once.  2. Bilateral low back pain without sciatica - traMADol (ULTRAM) 50 MG tablet; Take 1 tablet (50 mg total) by mouth every 8 (eight) hours as needed.  Dispense: 60 tablet; Refill: [place], FNP

## 2015-01-28 NOTE — Patient Instructions (Signed)

## 2015-04-08 ENCOUNTER — Encounter: Payer: Self-pay | Admitting: Family

## 2015-04-08 ENCOUNTER — Ambulatory Visit (INDEPENDENT_AMBULATORY_CARE_PROVIDER_SITE_OTHER): Payer: Medicare Other | Admitting: Family

## 2015-04-08 VITALS — BP 141/73 | HR 46 | Temp 97.6°F | Ht 64.0 in | Wt 157.0 lb

## 2015-04-08 DIAGNOSIS — G8929 Other chronic pain: Secondary | ICD-10-CM | POA: Diagnosis not present

## 2015-04-08 DIAGNOSIS — M549 Dorsalgia, unspecified: Secondary | ICD-10-CM | POA: Diagnosis not present

## 2015-04-08 DIAGNOSIS — I10 Essential (primary) hypertension: Secondary | ICD-10-CM

## 2015-04-08 MED ORDER — CVS LUMBAR/BACK SUPPORT BRACE MISC: 1.0000 | Freq: Every day | Status: DC

## 2015-04-08 NOTE — Progress Notes (Signed)
   Subjective:    Patient ID: Belinda Scott, female    DOB: Sep 06, 1940, 74 y.o.   MRN: [id number]  PT presents to the office today to recheck HTN. Pt's BP is not at goal today. Pt states she does not take her Norvasc regularly.  Pt states she may take it 2-3 times a week. Pt reports taking her Lotensin HCT 20-25 mg daily.  Hypertension This is a new problem. The current episode started more than 1 year ago. The problem has been waxing and waning since onset. The problem is uncontrolled. Pertinent negatives include no anxiety, headaches, palpitations, peripheral edema or shortness of breath. Risk factors for coronary artery disease include dyslipidemia, obesity, sedentary lifestyle and post-menopausal state. Past treatments include ACE inhibitors and diuretics. There is no history of kidney disease, CAD/MI, CVA, heart failure or a thyroid problem. There is no history of sleep apnea.  Back Pain This is a chronic problem. The current episode started more than 1 year ago. The problem occurs constantly. The problem is unchanged. The pain is present in the lumbar spine. The quality of the pain is described as aching. The pain is at a severity of 8/10. The pain is moderate. Pertinent negatives include no headaches. She has tried bed rest for the symptoms. The treatment provided moderate relief.      Review of Systems  Constitutional: Negative.   HENT: Negative.   Eyes: Negative.   Respiratory: Negative.  Negative for shortness of breath.   Cardiovascular: Negative.  Negative for palpitations.  Gastrointestinal: Negative.   Endocrine: Negative.   Genitourinary: Negative.   Musculoskeletal: Positive for back pain.  Neurological: Negative.  Negative for headaches.  Hematological: Negative.   Psychiatric/Behavioral: Negative.   All other systems reviewed and are negative.      Objective:   Physical Exam  Constitutional: She is oriented to person, place, and time. She appears well-developed and  well-nourished. No distress.  HENT:  Head: Normocephalic and atraumatic.  Right Ear: External ear normal.  Left Ear: External ear normal.  Nose: Nose normal.  Mouth/Throat: Oropharynx is clear and moist.  Eyes: Pupils are equal, round, and reactive to light.  Neck: Normal range of motion. Neck supple. No thyromegaly present.  Cardiovascular: Normal rate, regular rhythm, normal heart sounds and intact distal pulses.   No murmur heard. Pulmonary/Chest: Effort normal and breath sounds normal. No respiratory distress. She has no wheezes.  Abdominal: Soft. Bowel sounds are normal. She exhibits no distension. There is no tenderness.  Musculoskeletal: Normal range of motion. She exhibits no edema or tenderness.  Neurological: She is alert and oriented to person, place, and time. She has normal reflexes. No cranial nerve deficit.  Skin: Skin is warm and dry.  Psychiatric: She has a normal mood and affect. Her behavior is normal. Judgment and thought content normal.  Vitals reviewed.    BP 141/73 mmHg  Pulse 46  Temp(Src) 97.6 F (36.4 C) (Oral)  Ht _0  (1.626 m)  Wt 157 lb (71.215 kg)  BMI 26.94 kg/m2      Assessment & Plan:  1. Essential hypertension -Pt told to start Norvasc daily -Dash diet information given -Exercise encouraged - Stress Management  -Continue current meds -RTO in 3 months - BMP8+EGFR  2. Chronic back pain -Rest -Continue with Ultram - Elastic Bandages & Supports ([employer] LUMBAR/BACK SUPPORT BRACE) MISC; 1 Device by Does not apply route daily.  Dispense: 1 each; Refill: [place], FNP

## 2015-04-08 NOTE — Patient Instructions (Signed)
DASH Eating Plan DASH stands for "Dietary Approaches to Stop Hypertension." The DASH eating plan is a healthy eating plan that has been shown to reduce high blood pressure (hypertension). Additional health benefits may include reducing the risk of type 2 diabetes mellitus, heart disease, and stroke. The DASH eating plan may also help with weight loss. WHAT DO I NEED TO KNOW ABOUT THE DASH EATING PLAN? For the DASH eating plan, you will follow these general guidelines:  Choose foods with a percent daily value for sodium of less than 5% (as listed on the food label).  Use salt-free seasonings or herbs instead of table salt or sea salt.  Check with your health care provider or pharmacist before using salt substitutes.  Eat lower-sodium products, often labeled as "lower sodium" or "no salt added."  Eat fresh foods.  Eat more vegetables, fruits, and low-fat dairy products.  Choose whole grains. Look for the word "whole" as the first word in the ingredient list.  Choose fish and skinless chicken or turkey more often than red meat. Limit fish, poultry, and meat to 6 oz (170 g) each day.  Limit sweets, desserts, sugars, and sugary drinks.  Choose heart-healthy fats.  Limit cheese to 1 oz (28 g) per day.  Eat more home-cooked food and less restaurant, buffet, and fast food.  Limit fried foods.  Cook foods using methods other than frying.  Limit canned vegetables. If you do use them, rinse them well to decrease the sodium.  When eating at a restaurant, ask that your food be prepared with less salt, or no salt if possible. WHAT FOODS CAN I EAT? Seek help from a dietitian for individual calorie needs. Grains Whole grain or whole wheat bread. Brown rice. Whole grain or whole wheat pasta. Quinoa, bulgur, and whole grain cereals. Low-sodium cereals. Corn or whole wheat flour tortillas. Whole grain cornbread. Whole grain crackers. Low-sodium crackers. Vegetables Fresh or frozen vegetables  (raw, steamed, roasted, or grilled). Low-sodium or reduced-sodium tomato and vegetable juices. Low-sodium or reduced-sodium tomato sauce and paste. Low-sodium or reduced-sodium canned vegetables.  Fruits All fresh, canned (in natural juice), or frozen fruits. Meat and Other Protein Products Ground beef (85% or leaner), grass-fed beef, or beef trimmed of fat. Skinless chicken or turkey. Ground chicken or turkey. Pork trimmed of fat. All fish and seafood. Eggs. Dried beans, peas, or lentils. Unsalted nuts and seeds. Unsalted canned beans. Dairy Low-fat dairy products, such as skim or 1% milk, 2% or reduced-fat cheeses, low-fat ricotta or cottage cheese, or plain low-fat yogurt. Low-sodium or reduced-sodium cheeses. Fats and Oils Tub margarines without trans fats. Light or reduced-fat mayonnaise and salad dressings (reduced sodium). Avocado. Safflower, olive, or canola oils. Natural peanut or almond butter. Other Unsalted popcorn and pretzels. The items listed above may not be a complete list of recommended foods or beverages. Contact your dietitian for more options. WHAT FOODS ARE NOT RECOMMENDED? Grains White bread. White pasta. White rice. Refined cornbread. Bagels and croissants. Crackers that contain trans fat. Vegetables Creamed or fried vegetables. Vegetables in a cheese sauce. Regular canned vegetables. Regular canned tomato sauce and paste. Regular tomato and vegetable juices. Fruits Dried fruits. Canned fruit in light or heavy syrup. Fruit juice. Meat and Other Protein Products Fatty cuts of meat. Ribs, chicken wings, bacon, sausage, bologna, salami, chitterlings, fatback, hot dogs, bratwurst, and packaged luncheon meats. Salted nuts and seeds. Canned beans with salt. Dairy Whole or 2% milk, cream, half-and-half, and cream cheese. Whole-fat or sweetened yogurt. Full-fat   cheeses or blue cheese. Nondairy creamers and whipped toppings. Processed cheese, cheese spreads, or cheese  curds. Condiments Onion and garlic salt, seasoned salt, table salt, and sea salt. Canned and packaged gravies. Worcestershire sauce. Tartar sauce. Barbecue sauce. Teriyaki sauce. Soy sauce, including reduced sodium. Steak sauce. Fish sauce. Oyster sauce. Cocktail sauce. Horseradish. Ketchup and mustard. Meat flavorings and tenderizers. Bouillon cubes. Hot sauce. Tabasco sauce. Marinades. Taco seasonings. Relishes. Fats and Oils Butter, stick margarine, lard, shortening, ghee, and bacon fat. Coconut, palm kernel, or palm oils. Regular salad dressings. Other Pickles and olives. Salted popcorn and pretzels. The items listed above may not be a complete list of foods and beverages to avoid. Contact your dietitian for more information. WHERE CAN I FIND MORE INFORMATION? National [employer], [employer], [employer]: www.nhlbi.nih.gov/health/health-topics/topics/dash/ Document Released: 07/29/2011 Document Revised: 12/24/2013 Document Reviewed: 06/13/2013 ExitCare Patient Information 2015 [employer]. This information is not intended to replace advice given to you by your health care provider. Make sure you discuss any questions you have with your health care provider. Hypertension Hypertension, commonly called high blood pressure, is when the force of blood pumping through your arteries is too strong. Your arteries are the blood vessels that carry blood from your heart throughout your body. A blood pressure reading consists of a higher number over a lower number, such as 110/72. The higher number (systolic) is the pressure inside your arteries when your heart pumps. The lower number (diastolic) is the pressure inside your arteries when your heart relaxes. Ideally you want your blood pressure below 120/80. Hypertension forces your heart to work harder to pump blood. Your arteries may become narrow or stiff. Having hypertension puts you at risk for heart disease, stroke, and other problems.  RISK  FACTORS Some risk factors for high blood pressure are controllable. Others are not.  Risk factors you cannot control include:   Race. You may be at higher risk if you are African American.  Age. Risk increases with age.  Gender. Men are at higher risk than women before age 45 years. After age 65, women are at higher risk than men. Risk factors you can control include:  Not getting enough exercise or physical activity.  Being overweight.  Getting too much fat, sugar, calories, or salt in your diet.  Drinking too much alcohol. SIGNS AND SYMPTOMS Hypertension does not usually cause signs or symptoms. Extremely high blood pressure (hypertensive crisis) may cause headache, anxiety, shortness of breath, and nosebleed. DIAGNOSIS  To check if you have hypertension, your health care provider will measure your blood pressure while you are seated, with your arm held at the level of your heart. It should be measured at least twice using the same arm. Certain conditions can cause a difference in blood pressure between your right and left arms. A blood pressure reading that is higher than normal on one occasion does not mean that you need treatment. If one blood pressure reading is high, ask your health care provider about having it checked again. TREATMENT  Treating high blood pressure includes making lifestyle changes and possibly taking medicine. Living a healthy lifestyle can help lower high blood pressure. You may need to change some of your habits. Lifestyle changes may include:  Following the DASH diet. This diet is high in fruits, vegetables, and whole grains. It is low in salt, red meat, and added sugars.  Getting at least 2 hours of brisk physical activity every week.  Losing weight if necessary.  Not smoking.  Limiting   alcoholic beverages.  Learning ways to reduce stress. If lifestyle changes are not enough to get your blood pressure under control, your health care provider may  prescribe medicine. You may need to take more than one. Work closely with your health care provider to understand the risks and benefits. HOME CARE INSTRUCTIONS  Have your blood pressure rechecked as directed by your health care provider.   Take medicines only as directed by your health care provider. Follow the directions carefully. Blood pressure medicines must be taken as prescribed. The medicine does not work as well when you skip doses. Skipping doses also puts you at risk for problems.   Do not smoke.   Monitor your blood pressure at home as directed by your health care provider. SEEK MEDICAL CARE IF:   You think you are having a reaction to medicines taken.  You have recurrent headaches or feel dizzy.  You have swelling in your ankles.  You have trouble with your vision. SEEK IMMEDIATE MEDICAL CARE IF:  You develop a severe headache or confusion.  You have unusual weakness, numbness, or feel faint.  You have severe chest or abdominal pain.  You vomit repeatedly.  You have trouble breathing. MAKE SURE YOU:   Understand these instructions.  Will watch your condition.  Will get help right away if you are not doing well or get worse. Document Released: 08/09/2005 Document Revised: 12/24/2013 Document Reviewed: 06/01/2013 ExitCare Patient Information 2015 [employer]. This information is not intended to replace advice given to you by your health care provider. Make sure you discuss any questions you have with your health care provider.  

## 2015-04-09 ENCOUNTER — Other Ambulatory Visit: Payer: Self-pay

## 2015-04-09 LAB — BMP8+EGFR
BUN / CREAT RATIO: 21 (ref 11–26)
BUN: 17 mg/dL (ref 8–27)
CALCIUM: 9.5 mg/dL (ref 8.7–10.3)
CHLORIDE: 99 mmol/L (ref 97–108)
CO2: 25 mmol/L (ref 18–29)
Creatinine, Ser: 0.8 mg/dL (ref 0.57–1.00)
GFR calc Af Amer: 83 mL/min/{1.73_m2} (ref >59)
GFR calc non Af Amer: 72 mL/min/{1.73_m2} (ref >59)
GLUCOSE: 86 mg/dL (ref 65–99)
Potassium: 4.4 mmol/L (ref 3.5–5.2)
Sodium: 143 mmol/L (ref 134–144)

## 2015-04-09 NOTE — Telephone Encounter (Signed)
Last seen 04/08/15 [facility] sent over as 1 1/2 tablets daily in [facility] one daily??

## 2015-04-10 MED ORDER — AMLODIPINE BESYLATE 5 MG PO TABS: 5.0000 mg | ORAL_TABLET | Freq: Every day | ORAL | Status: DC

## 2015-05-06 ENCOUNTER — Ambulatory Visit (INDEPENDENT_AMBULATORY_CARE_PROVIDER_SITE_OTHER): Payer: Medicare Other | Admitting: Family

## 2015-05-06 ENCOUNTER — Encounter: Payer: Self-pay | Admitting: Family

## 2015-05-06 VITALS — BP 134/72 | HR 69 | Temp 97.8°F | Ht 64.0 in | Wt 159.2 lb

## 2015-05-06 DIAGNOSIS — M1990 Unspecified osteoarthritis, unspecified site: Secondary | ICD-10-CM | POA: Insufficient documentation

## 2015-05-06 DIAGNOSIS — M19011 Primary osteoarthritis, right shoulder: Secondary | ICD-10-CM

## 2015-05-06 DIAGNOSIS — G8929 Other chronic pain: Secondary | ICD-10-CM | POA: Diagnosis not present

## 2015-05-06 DIAGNOSIS — M25511 Pain in right shoulder: Secondary | ICD-10-CM

## 2015-05-06 DIAGNOSIS — M129 Arthropathy, unspecified: Secondary | ICD-10-CM | POA: Diagnosis not present

## 2015-05-06 MED ORDER — METHYLPREDNISOLONE ACETATE 40 MG/ML IJ SUSP
40.0000 mg | Freq: Once | INTRAMUSCULAR | Status: AC
  Administered 2015-05-06: 40 mg via INTRA_ARTICULAR

## 2015-05-06 MED ORDER — BUPIVACAINE HCL 0.25 % IJ SOLN
1.0000 mL | Freq: Once | INTRAMUSCULAR | Status: AC
  Administered 2015-05-06: 1 mL via INTRA_ARTICULAR

## 2015-05-06 NOTE — Progress Notes (Signed)
   Subjective:    Patient ID: XIAN APOSTOL, female    DOB: 1940-10-22, 74 y.o.   MRN: [id number]  Pt presents to the office today for right shoulder injection for arthritis.  Shoulder Pain  The pain is present in the right shoulder. This is a chronic problem. The current episode started more than 1 year ago. There has been no history of extremity trauma. The problem occurs intermittently. The problem has been unchanged. The quality of the pain is described as aching. The pain is at a severity of 8/10. The pain is moderate. Associated symptoms include stiffness. Pertinent negatives include no inability to bear weight, itching, limited range of motion, numbness or tingling. The symptoms are aggravated by activity. She has tried rest and oral narcotics for the symptoms. The treatment provided moderate relief.      Review of Systems  Constitutional: Negative.   HENT: Negative.   Eyes: Negative.   Respiratory: Negative.  Negative for shortness of breath.   Cardiovascular: Negative.  Negative for palpitations.  Gastrointestinal: Negative.   Endocrine: Negative.   Genitourinary: Negative.   Musculoskeletal: Positive for stiffness.  Skin: Negative for itching.  Neurological: Negative.  Negative for tingling, numbness and headaches.  Hematological: Negative.   Psychiatric/Behavioral: Negative.   All other systems reviewed and are negative.      Objective:   Physical Exam  Constitutional: She is oriented to person, place, and time. She appears well-developed and well-nourished. No distress.  HENT:  Head: Normocephalic and atraumatic.  Eyes: Pupils are equal, round, and reactive to light.  Neck: Normal range of motion. Neck supple. No thyromegaly present.  Cardiovascular: Normal rate, regular rhythm, normal heart sounds and intact distal pulses.   No murmur heard. Pulmonary/Chest: Effort normal and breath sounds normal. No respiratory distress. She has no wheezes.  Abdominal: Soft. Bowel  sounds are normal. She exhibits no distension. There is no tenderness.  Musculoskeletal: Normal range of motion. She exhibits no edema or tenderness.  Fulll ROM of right shoulder- Slow movements r/t to pain   Neurological: She is alert and oriented to person, place, and time. She has normal reflexes. No cranial nerve deficit.  Skin: Skin is warm and dry.  Psychiatric: She has a normal mood and affect. Her behavior is normal. Judgment and thought content normal.  Vitals reviewed.    BP 134/72 mmHg  Pulse 69  Temp(Src) 97.8 F (36.6 C) (Oral)  Ht 5\' 4"  (1.626 m)  Wt 159 lb 3.2 oz (72.213 kg)  BMI 27.31 kg/m2   Procedure note: Area cleaned with chlorascrub With patient siting with arm resting at her side Depo-Medrol and Lidocaine injected posterior in AC joint Band-Aid applied    Assessment & Plan:  1. Arthritis of right shoulder region --Rest -Ice as needed -S/S of infection discussed -RTO prn - bupivacaine (MARCAINE) 0.25 % (with pres) injection 1 mL; Inject 1 mL into the articular space once. - methylPREDNISolone acetate (DEPO-MEDROL) injection 40 mg; Inject 1 mL (40 mg total) into the articular space once.  2. Chronic right shoulder pain - bupivacaine (MARCAINE) 0.25 % (with pres) injection 1 mL; Inject 1 mL into the articular space once. - methylPREDNISolone acetate (DEPO-MEDROL) injection 40 mg; Inject 1 mL (40 mg total) into the articular space once.  Evelina Dun, FNP

## 2015-05-06 NOTE — Patient Instructions (Signed)

## 2015-07-09 ENCOUNTER — Telehealth: Payer: Self-pay | Admitting: Family

## 2015-07-09 NOTE — Telephone Encounter (Signed)
Had flu shot two weeks ago at [facility]

## 2015-07-09 NOTE — Telephone Encounter (Signed)
Patient is aware that you will not be back in office until tomorrow.

## 2015-07-10 NOTE — Telephone Encounter (Signed)
Pt scheduled with Christy 11/23 at 11:10.

## 2015-07-10 NOTE — Telephone Encounter (Signed)
Pt can schedule appt to have injection

## 2015-07-16 ENCOUNTER — Encounter: Payer: Self-pay | Admitting: Family

## 2015-07-16 ENCOUNTER — Ambulatory Visit (INDEPENDENT_AMBULATORY_CARE_PROVIDER_SITE_OTHER): Payer: Medicare Other | Admitting: Family

## 2015-07-16 VITALS — BP 156/73 | HR 71 | Temp 97.3°F | Ht 64.0 in | Wt 159.4 lb

## 2015-07-16 DIAGNOSIS — M25511 Pain in right shoulder: Secondary | ICD-10-CM | POA: Diagnosis not present

## 2015-07-16 DIAGNOSIS — M19011 Primary osteoarthritis, right shoulder: Secondary | ICD-10-CM

## 2015-07-16 DIAGNOSIS — M545 Low back pain, unspecified: Secondary | ICD-10-CM

## 2015-07-16 DIAGNOSIS — M129 Arthropathy, unspecified: Secondary | ICD-10-CM | POA: Diagnosis not present

## 2015-07-16 DIAGNOSIS — Z23 Encounter for immunization: Secondary | ICD-10-CM | POA: Diagnosis not present

## 2015-07-16 MED ORDER — BUPIVACAINE HCL 0.25 % IJ SOLN
1.0000 mL | Freq: Once | INTRAMUSCULAR | Status: AC
  Administered 2015-07-16: 1 mL via INTRA_ARTICULAR

## 2015-07-16 MED ORDER — TRAMADOL HCL 50 MG PO TABS: 50.0000 mg | ORAL_TABLET | Freq: Three times a day (TID) | ORAL | Status: DC | PRN

## 2015-07-16 MED ORDER — METHYLPREDNISOLONE ACETATE 80 MG/ML IJ SUSP
80.0000 mg | Freq: Once | INTRAMUSCULAR | Status: AC
  Administered 2015-07-16: 80 mg via INTRA_ARTICULAR

## 2015-07-16 NOTE — Patient Instructions (Signed)

## 2015-07-16 NOTE — Progress Notes (Signed)
   Subjective:    Patient ID: Belinda Scott, female    DOB: 01-03-1941, 74 y.o.   MRN: [id number]  Pt presents to the office today for right shoulder injection for arthritis. Shoulder Pain  The pain is present in the right shoulder. This is a chronic problem. The current episode started more than 1 year ago. There has been no history of extremity trauma. The problem occurs intermittently. The problem has been unchanged. The quality of the pain is described as aching. The pain is at a severity of 8/10. The pain is moderate. Associated symptoms include stiffness. Pertinent negatives include no inability to bear weight, itching, limited range of motion, numbness or tingling. The symptoms are aggravated by activity. She has tried rest and oral narcotics for the symptoms. The treatment provided moderate relief.      Review of Systems  Constitutional: Negative.   HENT: Negative.   Eyes: Negative.   Respiratory: Negative.  Negative for shortness of breath.   Cardiovascular: Negative.  Negative for palpitations.  Gastrointestinal: Negative.   Endocrine: Negative.   Genitourinary: Negative.   Musculoskeletal: Positive for stiffness.  Skin: Negative for itching.  Neurological: Negative.  Negative for tingling, numbness and headaches.  Hematological: Negative.   Psychiatric/Behavioral: Negative.   All other systems reviewed and are negative.      Objective:   Physical Exam  Constitutional: She is oriented to person, place, and time. She appears well-developed and well-nourished. No distress.  HENT:  Head: Normocephalic and atraumatic.  Eyes: Pupils are equal, round, and reactive to light.  Neck: Normal range of motion. Neck supple. No thyromegaly present.  Cardiovascular: Normal rate, regular rhythm, normal heart sounds and intact distal pulses.   No murmur heard. Pulmonary/Chest: Effort normal and breath sounds normal. No respiratory distress. She has no wheezes.  Abdominal: Soft. Bowel  sounds are normal. She exhibits no distension. There is no tenderness.  Musculoskeletal: Normal range of motion. She exhibits no edema or tenderness.  Slow ROM of right arm with extending relating to pain   Neurological: She is alert and oriented to person, place, and time. She has normal reflexes. No cranial nerve deficit.  Skin: Skin is warm and dry.  Psychiatric: She has a normal mood and affect. Her behavior is normal. Judgment and thought content normal.  Vitals reviewed.  Procedure note: Area cleaned with chlorhexidine With patient siting with arm resting at her side Depo-Medrol and Lidocaine injected posterior in AC joint Band-Aid applied  BP 156/73 mmHg  Pulse 71  Temp(Src) 97.3 F (36.3 C) (Oral)  Ht 5\' 4"  (1.626 m)  Wt 159 lb 6.4 oz (72.303 kg)  BMI 27.35 kg/m2      Assessment & Plan:  1. Arthritis of right shoulder region -S/S of infection discussed -RTO prn  - bupivacaine (MARCAINE) 0.25 % (with pres) injection 1 mL; Inject 1 mL into the articular space once. - methylPREDNISolone acetate (DEPO-MEDROL) injection 80 mg; Inject 1 mL (80 mg total) into the articular space once. - traMADol (ULTRAM) 50 MG tablet; Take 1 tablet (50 mg total) by mouth every 8 (eight) hours as needed.  Dispense: 60 tablet; Refill: 2  2. Bilateral low back pain without sciatica Rest ROM exercises discussed - traMADol (ULTRAM) 50 MG tablet; Take 1 tablet (50 mg total) by mouth every 8 (eight) hours as needed.  Dispense: 60 tablet; Refill: 2  Prevnar 13 given today  Evelina Dun, FNP

## 2015-09-22 ENCOUNTER — Other Ambulatory Visit: Payer: Self-pay | Admitting: Family

## 2015-09-23 NOTE — Telephone Encounter (Signed)
RX ready for pick up 

## 2015-09-23 NOTE — Telephone Encounter (Signed)
Last seen 07/16/15  Christy   If approved print 

## 2015-10-23 ENCOUNTER — Other Ambulatory Visit: Payer: Self-pay | Admitting: Family

## 2015-10-30 ENCOUNTER — Ambulatory Visit (INDEPENDENT_AMBULATORY_CARE_PROVIDER_SITE_OTHER): Payer: Medicare Other | Admitting: Family

## 2015-10-30 ENCOUNTER — Encounter: Payer: Self-pay | Admitting: Family

## 2015-10-30 VITALS — BP 137/63 | HR 68 | Temp 97.3°F | Ht 64.0 in | Wt 160.2 lb

## 2015-10-30 DIAGNOSIS — M129 Arthropathy, unspecified: Secondary | ICD-10-CM

## 2015-10-30 DIAGNOSIS — M19011 Primary osteoarthritis, right shoulder: Secondary | ICD-10-CM

## 2015-10-30 DIAGNOSIS — M25511 Pain in right shoulder: Secondary | ICD-10-CM | POA: Diagnosis not present

## 2015-10-30 MED ORDER — TRAMADOL HCL 50 MG PO TABS: 50.0000 mg | ORAL_TABLET | Freq: Three times a day (TID) | ORAL | Status: DC | PRN

## 2015-10-30 MED ORDER — CELECOXIB 200 MG PO CAPS: 200.0000 mg | ORAL_CAPSULE | Freq: Two times a day (BID) | ORAL | Status: DC

## 2015-10-30 MED ORDER — BUPIVACAINE HCL 0.25 % IJ SOLN
1.0000 mL | Freq: Once | INTRAMUSCULAR | Status: AC
  Administered 2015-10-30: 1 mL via INTRA_ARTICULAR

## 2015-10-30 MED ORDER — METHYLPREDNISOLONE ACETATE 40 MG/ML IJ SUSP
40.0000 mg | Freq: Once | INTRAMUSCULAR | Status: AC
  Administered 2015-10-30: 40 mg via INTRA_ARTICULAR

## 2015-10-30 MED ORDER — ALPRAZOLAM 0.25 MG PO TABS: 0.2500 mg | ORAL_TABLET | Freq: Two times a day (BID) | ORAL | Status: DC | PRN

## 2015-10-30 NOTE — Patient Instructions (Signed)
Shoulder Range of Motion Exercises Shoulder range of motion (ROM) exercises are designed to keep the shoulder moving freely. They are often recommended for people who have shoulder pain. MOVEMENT EXERCISE When you are able, do this exercise 5-6 days per week, or as told by your health care provider. Work toward doing 2 sets of 10 swings. Pendulum Exercise How To Do This Exercise Lying Down 1. Lie face-down on a bed with your abdomen close to the side of the bed. 2. Let your arm hang over the side of the bed. 3. Relax your shoulder, arm, and hand. 4. Slowly and gently swing your arm forward and back. Do not use your neck muscles to swing your arm. They should be relaxed. If you are struggling to swing your arm, have someone gently swing it for you. When you do this exercise for the first time, swing your arm at a 15 degree angle for 15 seconds, or swing your arm 10 times. As pain lessens over time, increase the angle of the swing to 30-45 degrees. 5. Repeat steps 1-4 with the other arm. How To Do This Exercise While Standing 1. Stand next to a sturdy chair or table and hold on to it with your hand.  Bend forward at the waist.  Bend your knees slightly.  Relax your other arm and let it hang limp.  Relax the shoulder blade of the arm that is hanging and let it drop.  While keeping your shoulder relaxed, use body motion to swing your arm in small circles. The first time you do this exercise, swing your arm for about 30 seconds or 10 times. When you do it next time, swing your arm for a little longer.  Stand up tall and relax.  Repeat steps 1-7, this time changing the direction of the circles. 2. Repeat steps 1-8 with the other arm. STRETCHING EXERCISES Do these exercises 3-4 times per day on 5-6 days per week or as told by your health care provider. Work toward holding the stretch for 20 seconds. Stretching Exercise 1 1. Lift your arm straight out in front of you. 2. Bend your arm 90  degrees at the elbow (right angle) so your forearm goes across your body and looks like the letter "L." 3. Use your other arm to gently pull the elbow forward and across your body. 4. Repeat steps 1-3 with the other arm. Stretching Exercise 2 You will need a towel or rope for this exercise. 1. Bend one arm behind your back with the palm facing outward. 2. Hold a towel with your other hand. 3. Reach the arm that holds the towel above your head, and bend that arm at the elbow. Your wrist should be behind your neck. 4. Use your free hand to grab the free end of the towel. 5. With the higher hand, gently pull the towel up behind you. 6. With the lower hand, pull the towel down behind you. 7. Repeat steps 1-6 with the other arm. STRENGTHENING EXERCISES Do each of these exercises at four different times of day (sessions) every day or as told by your health care provider. To begin with, repeat each exercise 5 times (repetitions). Work toward doing 3 sets of 12 repetitions or as told by your health care provider. Strengthening Exercise 1 You will need a light weight for this activity. As you grow stronger, you may use a heavier weight. 1. Standing with a weight in your hand, lift your arm straight out to the side   until it is at the same height as your shoulder. 2. Bend your arm at 90 degrees so that your fingers are pointing to the ceiling. 3. Slowly raise your hand until your arm is straight up in the air. 4. Repeat steps 1-3 with the other arm. Strengthening Exercise 2 You will need a light weight for this activity. As you grow stronger, you may use a heavier weight. 1. Standing with a weight in your hand, gradually move your straight arm in an arc, starting at your side, then out in front of you, then straight up over your head. 2. Gradually move your other arm in an arc, starting at your side, then out in front of you, then straight up over your head. 3. Repeat steps 1-2 with the other  arm. Strengthening Exercise 3 You will need an elastic band for this activity. As you grow stronger, gradually increase the size of the bands or increase the number of bands that you use at one time. 1. While standing, hold an elastic band in one hand and raise that arm up in the air. 2. With your other hand, pull down the band until that hand is by your side. 3. Repeat steps 1-2 with the other arm.   This information is not intended to replace advice given to you by your health care provider. Make sure you discuss any questions you have with your health care provider.   Document Released: 05/08/2003 Document Revised: 12/24/2014 Document Reviewed: 08/05/2014 Elsevier Interactive Patient Education 2016 [employer] Back Exercises The following exercises strengthen the muscles that help to support the back. They also help to keep the lower back flexible. Doing these exercises can help to prevent back pain or lessen existing pain. If you have back pain or discomfort, try doing these exercises 2-3 times each day or as told by your health care provider. When the pain goes away, do them once each day, but increase the number of times that you repeat the steps for each exercise (do more repetitions). If you do not have back pain or discomfort, do these exercises once each day or as told by your health care provider. EXERCISES Single Knee to Chest Repeat these steps 3-5 times for each leg: 6. Lie on your back on a firm bed or the floor with your legs extended. 7. Bring one knee to your chest. Your other leg should stay extended and in contact with the floor. 8. Hold your knee in place by grabbing your knee or thigh. 9. Pull on your knee until you feel a gentle stretch in your lower back. 10. Hold the stretch for 10-30 seconds. 11. Slowly release and straighten your leg. Pelvic Tilt Repeat these steps 5-10 times: 3. Lie on your back on a firm bed or the floor with your legs extended. 4. Bend your  knees so they are pointing toward the ceiling and your feet are flat on the floor. 5. Tighten your lower abdominal muscles to press your lower back against the floor. This motion will tilt your pelvis so your tailbone points up toward the ceiling instead of pointing to your feet or the floor. 6. With gentle tension and even breathing, hold this position for 5-10 seconds. Cat-Cow Repeat these steps until your lower back becomes more flexible: 5. Get into a hands-and-knees position on a firm surface. Keep your hands under your shoulders, and keep your knees under your hips. You may place padding under your knees for comfort. 6. Let your head  hang down, and point your tailbone toward the floor so your lower back becomes rounded like the back of a cat. 7. Hold this position for 5 seconds. 8. Slowly lift your head and point your tailbone up toward the ceiling so your back forms a sagging arch like the back of a cow. 9. Hold this position for 5 seconds. Press-Ups Repeat these steps 5-10 times: 8. Lie on your abdomen (face-down) on the floor. 9. Place your palms near your head, about shoulder-width apart. 10. While you keep your back as relaxed as possible and keep your hips on the floor, slowly straighten your arms to raise the top half of your body and lift your shoulders. Do not use your back muscles to raise your upper torso. You may adjust the placement of your hands to make yourself more comfortable. 11. Hold this position for 5 seconds while you keep your back relaxed. 12. Slowly return to lying flat on the floor. Bridges Repeat these steps 10 times: 5. Lie on your back on a firm surface. 6. Bend your knees so they are pointing toward the ceiling and your feet are flat on the floor. 7. Tighten your buttocks muscles and lift your buttocks off of the floor until your waist is at almost the same height as your knees. You should feel the muscles working in your buttocks and the back of your thighs.  If you do not feel these muscles, slide your feet 1-2 inches farther away from your buttocks. 8. Hold this position for 3-5 seconds. 9. Slowly lower your hips to the starting position, and allow your buttocks muscles to relax completely. If this exercise is too easy, try doing it with your arms crossed over your chest. Abdominal Crunches Repeat these steps 5-10 times: 4. Lie on your back on a firm bed or the floor with your legs extended. 5. Bend your knees so they are pointing toward the ceiling and your feet are flat on the floor. 6. Cross your arms over your chest. 7. Tip your chin slightly toward your chest without bending your neck. 8. Tighten your abdominal muscles and slowly raise your trunk (torso) high enough to lift your shoulder blades a tiny bit off of the floor. Avoid raising your torso higher than that, because it can put too much stress on your low back and it does not help to strengthen your abdominal muscles. 9. Slowly return to your starting position. Back Lifts Repeat these steps 5-10 times: 4. Lie on your abdomen (face-down) with your arms at your sides, and rest your forehead on the floor. 5. Tighten the muscles in your legs and your buttocks. 6. Slowly lift your chest off of the floor while you keep your hips pressed to the floor. Keep the back of your head in line with the curve in your back. Your eyes should be looking at the floor. 7. Hold this position for 3-5 seconds. 8. Slowly return to your starting position. SEEK MEDICAL CARE IF:  Your back pain or discomfort gets much worse when you do an exercise.  Your back pain or discomfort does not lessen within 2 hours after you exercise. If you have any of these problems, stop doing these exercises right away. Do not do them again unless your health care provider says that you can. SEEK IMMEDIATE MEDICAL CARE IF:  You develop sudden, severe back pain. If this happens, stop doing the exercises right away. Do not do them  again unless your health care provider  says that you can.   This information is not intended to replace advice given to you by your health care provider. Make sure you discuss any questions you have with your health care provider.   Document Released: 09/16/2004 Document Revised: 04/30/2015 Document Reviewed: 10/03/2014 Elsevier Interactive Patient Education [employer].

## 2015-10-30 NOTE — Progress Notes (Signed)
Subjective:    Patient ID: Belinda Scott, female    DOB: May 14, 1941, 75 y.o.   MRN: [id number]  Pt presents to the office today for right shoulder injection for arthritis. Shoulder Pain  The pain is present in the right shoulder. This is a chronic problem. The current episode started more than 1 year ago. There has been no history of extremity trauma. The problem occurs intermittently. The problem has been unchanged. The quality of the pain is described as aching. The pain is at a severity of 8/10. The pain is moderate. Pertinent negatives include no inability to bear weight, itching, limited range of motion, numbness or tingling. The symptoms are aggravated by activity. She has tried rest and oral narcotics for the symptoms. The treatment provided moderate relief. Her past medical history is significant for osteoarthritis.      Review of Systems  Constitutional: Negative.   HENT: Negative.   Eyes: Negative.   Respiratory: Negative.  Negative for shortness of breath.   Cardiovascular: Negative.  Negative for palpitations.  Gastrointestinal: Negative.   Endocrine: Negative.   Genitourinary: Negative.   Musculoskeletal: Negative.   Skin: Negative for itching.  Neurological: Negative.  Negative for tingling, numbness and headaches.  Hematological: Negative.   Psychiatric/Behavioral: Negative.   All other systems reviewed and are negative.      Objective:   Physical Exam  Constitutional: She is oriented to person, place, and time. She appears well-developed and well-nourished. No distress.  HENT:  Head: Normocephalic and atraumatic.  Eyes: Pupils are equal, round, and reactive to light.  Neck: Normal range of motion. Neck supple. No thyromegaly present.  Cardiovascular: Normal rate, regular rhythm, normal heart sounds and intact distal pulses.   No murmur heard. Pulmonary/Chest: Effort normal and breath sounds normal. No respiratory distress. She has no wheezes.  Abdominal:  Soft. Bowel sounds are normal. She exhibits no distension. There is no tenderness.  Musculoskeletal: She exhibits no edema or tenderness.  Slow ROM of right arm with extending related to pain  Neurological: She is alert and oriented to person, place, and time. She has normal reflexes. No cranial nerve deficit.  Skin: Skin is warm and dry.  Psychiatric: She has a normal mood and affect. Her behavior is normal. Judgment and thought content normal.  Vitals reviewed.   rightshoulder prepped with betadine Injected with Marcaine .5% plain and methylprednisolone and marcaine with 22 guage needle. Patient tolerated well.  BP 137/63 mmHg  Pulse 68  Temp(Src) 97.3 F (36.3 C) (Oral)  Ht 5\' 4"  (1.626 m)  Wt 160 lb 3.2 oz (72.666 kg)  BMI 27.48 kg/m2       Assessment & Plan:  1. Arthritis of right shoulder region -Pt given ROM exercises discussed with patient -Pt started on Celebrex today - bupivacaine (MARCAINE) 0.25 % (with pres) injection 1 mL; Inject 1 mL into the articular space once. - methylPREDNISolone acetate (DEPO-MEDROL) injection 40 mg; Inject 1 mL (40 mg total) into the articular space once. - celecoxib (CELEBREX) 200 MG capsule; Take 1 capsule (200 mg total) by mouth 2 (two) times daily.  Dispense: 60 capsule; Refill: 3 - traMADol (ULTRAM) 50 MG tablet; Take 1 tablet (50 mg total) by mouth every 8 (eight) hours as needed.  Dispense: 60 tablet; Refill: 3  Pt states she is having a dental procedure next week and states she needs something for her "nerves". Pt given xanax 0.25 mg # 15. Told patient to only take as needed.  [employer]  Lenna Gilford, [place]

## 2015-10-30 NOTE — Addendum Note (Signed)
Addended by: Shelbie Ammons on: 10/30/2015 11:15 AM   Modules accepted: Miquel Dunn

## 2015-10-30 NOTE — Addendum Note (Signed)
Addended by: Evelina Dun A on: 10/30/2015 11:18 AM   Modules accepted: SmartSet

## 2015-11-25 ENCOUNTER — Telehealth: Payer: Self-pay | Admitting: Family

## 2015-11-25 NOTE — Telephone Encounter (Signed)
FYI

## 2015-11-27 ENCOUNTER — Telehealth: Payer: Self-pay

## 2015-11-27 MED ORDER — MELOXICAM 7.5 MG PO TABS: 7.5000 mg | ORAL_TABLET | Freq: Every day | ORAL | Status: DC

## 2015-11-27 NOTE — Telephone Encounter (Signed)
Insurance denied celecoxib. Pt to restart mobic

## 2015-11-27 NOTE — Telephone Encounter (Signed)
Insurance denied prior authorization for Celecoxib    Preferred are Diflunisal etodolac and ketoprofen

## 2015-12-11 ENCOUNTER — Other Ambulatory Visit: Payer: Self-pay | Admitting: Family

## 2015-12-11 NOTE — Telephone Encounter (Signed)
Last seen 10/30/15  Christy  Last lipid 12/02/14

## 2015-12-16 ENCOUNTER — Encounter: Payer: Self-pay | Admitting: Family

## 2015-12-16 ENCOUNTER — Ambulatory Visit (INDEPENDENT_AMBULATORY_CARE_PROVIDER_SITE_OTHER): Payer: Medicare Other | Admitting: Family

## 2015-12-16 VITALS — BP 136/73 | HR 67 | Temp 97.0°F | Ht 64.0 in | Wt 162.2 lb

## 2015-12-16 DIAGNOSIS — Z0000 Encounter for general adult medical examination without abnormal findings: Secondary | ICD-10-CM | POA: Diagnosis not present

## 2015-12-16 NOTE — Patient Instructions (Signed)
Health Maintenance, Female Adopting a healthy lifestyle and getting preventive care can go a long way to promote health and wellness. Talk with your health care provider about what schedule of regular examinations is right for you. This is a good chance for you to check in with your provider about disease prevention and staying healthy. In between checkups, there are plenty of things you can do on your own. Experts have done a lot of research about which lifestyle changes and preventive measures are most likely to keep you healthy. Ask your health care provider for more information. WEIGHT AND DIET  Eat a healthy diet  Be sure to include plenty of vegetables, fruits, low-fat dairy products, and lean protein.  Do not eat a lot of foods high in solid fats, added sugars, or salt.  Get regular exercise. This is one of the most important things you can do for your health.  Most adults should exercise for at least 150 minutes each week. The exercise should increase your heart rate and make you sweat (moderate-intensity exercise).  Most adults should also do strengthening exercises at least twice a week. This is in addition to the moderate-intensity exercise.  Maintain a healthy weight  Body mass index (BMI) is a measurement that can be used to identify possible weight problems. It estimates body fat based on height and weight. Your health care provider can help determine your BMI and help you achieve or maintain a healthy weight.  For females 20 years of age and older:   A BMI below 18.5 is considered underweight.  A BMI of 18.5 to 24.9 is normal.  A BMI of 25 to 29.9 is considered overweight.  A BMI of 30 and above is considered obese.  Watch levels of cholesterol and blood lipids  You should start having your blood tested for lipids and cholesterol at 77 years of age, then have this test every 5 years.  You may need to have your cholesterol levels checked more often if:  Your lipid  or cholesterol levels are high.  You are older than 77 years of age.  You are at high risk for heart disease.  CANCER SCREENING   Lung Cancer  Lung cancer screening is recommended for adults 55-80 years old who are at high risk for lung cancer because of a history of smoking.  A yearly low-dose CT scan of the lungs is recommended for people who:  Currently smoke.  Have quit within the past 15 years.  Have at least a 30-pack-year history of smoking. A pack year is smoking an average of one pack of cigarettes a day for 1 year.  Yearly screening should continue until it has been 15 years since you quit.  Yearly screening should stop if you develop a health problem that would prevent you from having lung cancer treatment.  Breast Cancer  Practice breast self-awareness. This means understanding how your breasts normally appear and feel.  It also means doing regular breast self-exams. Let your health care provider know about any changes, no matter how small.  If you are in your 20s or 30s, you should have a clinical breast exam (CBE) by a health care provider every 1-3 years as part of a regular health exam.  If you are 40 or older, have a CBE every year. Also consider having a breast X-ray (mammogram) every year.  If you have a family history of breast cancer, talk to your health care provider about genetic screening.  If you   are at high risk for breast cancer, talk to your health care provider about having an MRI and a mammogram every year.  Breast cancer gene (BRCA) assessment is recommended for women who have family members with BRCA-related cancers. BRCA-related cancers include:  Breast.  Ovarian.  Tubal.  Peritoneal cancers.  Results of the assessment will determine the need for genetic counseling and BRCA1 and BRCA2 testing. Cervical Cancer Your health care provider may recommend that you be screened regularly for cancer of the pelvic organs (ovaries, uterus, and  vagina). This screening involves a pelvic examination, including checking for microscopic changes to the surface of your cervix (Pap test). You may be encouraged to have this screening done every 3 years, beginning at age 21.  For women ages 30-65, health care providers may recommend pelvic exams and Pap testing every 3 years, or they may recommend the Pap and pelvic exam, combined with testing for human papilloma virus (HPV), every 5 years. Some types of HPV increase your risk of cervical cancer. Testing for HPV may also be done on women of any age with unclear Pap test results.  Other health care providers may not recommend any screening for nonpregnant women who are considered low risk for pelvic cancer and who do not have symptoms. Ask your health care provider if a screening pelvic exam is right for you.  If you have had past treatment for cervical cancer or a condition that could lead to cancer, you need Pap tests and screening for cancer for at least 20 years after your treatment. If Pap tests have been discontinued, your risk factors (such as having a new sexual partner) need to be reassessed to determine if screening should resume. Some women have medical problems that increase the chance of getting cervical cancer. In these cases, your health care provider may recommend more frequent screening and Pap tests. Colorectal Cancer  This type of cancer can be detected and often prevented.  Routine colorectal cancer screening usually begins at 77 years of age and continues through 77 years of age.  Your health care provider may recommend screening at an earlier age if you have risk factors for colon cancer.  Your health care provider may also recommend using home test kits to check for hidden blood in the stool.  A small camera at the end of a tube can be used to examine your colon directly (sigmoidoscopy or colonoscopy). This is done to check for the earliest forms of colorectal  cancer.  Routine screening usually begins at age 50.  Direct examination of the colon should be repeated every 5-10 years through 77 years of age. However, you may need to be screened more often if early forms of precancerous polyps or small growths are found. Skin Cancer  Check your skin from head to toe regularly.  Tell your health care provider about any new moles or changes in moles, especially if there is a change in a mole's shape or color.  Also tell your health care provider if you have a mole that is larger than the size of a pencil eraser.  Always use sunscreen. Apply sunscreen liberally and repeatedly throughout the day.  Protect yourself by wearing long sleeves, pants, a wide-brimmed hat, and sunglasses whenever you are outside. HEART DISEASE, DIABETES, AND HIGH BLOOD PRESSURE   High blood pressure causes heart disease and increases the risk of stroke. High blood pressure is more likely to develop in:  People who have blood pressure in the high end   of the normal range (130-139/85-89 mm Hg).  People who are overweight or obese.  People who are African American.  If you are 38-23 years of age, have your blood pressure checked every 3-5 years. If you are 61 years of age or older, have your blood pressure checked every year. You should have your blood pressure measured twice--once when you are at a hospital or clinic, and once when you are not at a hospital or clinic. Record the average of the two measurements. To check your blood pressure when you are not at a hospital or clinic, you can use:  An automated blood pressure machine at a pharmacy.  A home blood pressure monitor.  If you are between 45 years and 39 years old, ask your health care provider if you should take aspirin to prevent strokes.  Have regular diabetes screenings. This involves taking a blood sample to check your fasting blood sugar level.  If you are at a normal weight and have a low risk for diabetes,  have this test once every three years after 77 years of age.  If you are overweight and have a high risk for diabetes, consider being tested at a younger age or more often. PREVENTING INFECTION  Hepatitis B  If you have a higher risk for hepatitis B, you should be screened for this virus. You are considered at high risk for hepatitis B if:  You were born in a country where hepatitis B is common. Ask your health care provider which countries are considered high risk.  Your parents were born in a high-risk country, and you have not been immunized against hepatitis B (hepatitis B vaccine).  You have HIV or AIDS.  You use needles to inject street drugs.  You live with someone who has hepatitis B.  You have had sex with someone who has hepatitis B.  You get hemodialysis treatment.  You take certain medicines for conditions, including cancer, organ transplantation, and autoimmune conditions. Hepatitis C  Blood testing is recommended for:  Everyone born from [birth year] through 1967.  Anyone with known risk factors for hepatitis C. Sexually transmitted infections (STIs)  You should be screened for sexually transmitted infections (STIs) including gonorrhea and chlamydia if:  You are sexually active and are younger than 77 years of age.  You are older than 77 years of age and your health care provider tells you that you are at risk for this type of infection.  Your sexual activity has changed since you were last screened and you are at an increased risk for chlamydia or gonorrhea. Ask your health care provider if you are at risk.  If you do not have HIV, but are at risk, it may be recommended that you take a prescription medicine daily to prevent HIV infection. This is called pre-exposure prophylaxis (PrEP). You are considered at risk if:  You are sexually active and do not regularly use condoms or know the HIV status of your partner(s).  You take drugs by injection.  You are sexually  active with a partner who has HIV. Talk with your health care provider about whether you are at high risk of being infected with HIV. If you choose to begin PrEP, you should first be tested for HIV. You should then be tested every 3 months for as long as you are taking PrEP.  PREGNANCY   If you are premenopausal and you may become pregnant, ask your health care provider about preconception counseling.  If you may  become pregnant, take 400 to 800 micrograms (mcg) of folic acid every day.  If you want to prevent pregnancy, talk to your health care provider about birth control (contraception). OSTEOPOROSIS AND MENOPAUSE   Osteoporosis is a disease in which the bones lose minerals and strength with aging. This can result in serious bone fractures. Your risk for osteoporosis can be identified using a bone density scan.  If you are 65 years of age or older, or if you are at risk for osteoporosis and fractures, ask your health care provider if you should be screened.  Ask your health care provider whether you should take a calcium or vitamin D supplement to lower your risk for osteoporosis.  Menopause may have certain physical symptoms and risks.  Hormone replacement therapy may reduce some of these symptoms and risks. Talk to your health care provider about whether hormone replacement therapy is right for you.  HOME CARE INSTRUCTIONS   Schedule regular health, dental, and eye exams.  Stay current with your immunizations.   Do not use any tobacco products including cigarettes, chewing tobacco, or electronic cigarettes.  If you are pregnant, do not drink alcohol.  If you are breastfeeding, limit how much and how often you drink alcohol.  Limit alcohol intake to no more than 1 drink per day for nonpregnant women. One drink equals 12 ounces of beer, 5 ounces of wine, or 1 ounces of hard liquor.  Do not use street drugs.  Do not share needles.  Ask your health care provider for help if  you need support or information about quitting drugs.  Tell your health care provider if you often feel depressed.  Tell your health care provider if you have ever been abused or do not feel safe at home.   This information is not intended to replace advice given to you by your health care provider. Make sure you discuss any questions you have with your health care provider.   Document Released: 02/22/2011 Document Revised: 08/30/2014 Document Reviewed: 07/11/2013 Elsevier Interactive Patient Education 2016 [employer]  

## 2015-12-16 NOTE — Progress Notes (Signed)
Subjective:    Belinda Scott is a 75 y.o. female who presents for Medicare Annual/Subsequent preventive examination.  Preventive Screening-Counseling & Management  Tobacco History  Smoking status  . Never Smoker   Smokeless tobacco  . Not on file     Problems Prior to Visit 1.   Current Problems (verified) Patient Active Problem List   Diagnosis Date Noted  . Arthritis of right shoulder region 05/06/2015  . HTN (hypertension) 11/24/2012  . HLD (hyperlipidemia) 11/24/2012  . Back pain, chronic 11/24/2012  . Vitamin D deficiency 11/24/2012    Medications Prior to Visit Current Outpatient Prescriptions on File Prior to Visit  Medication Sig Dispense Refill  . ALPRAZolam (XANAX) 0.25 MG tablet Take 1 tablet (0.25 mg total) by mouth 2 (two) times daily as needed for anxiety (Dental procedure). 15 tablet 0  . amLODipine (NORVASC) 5 MG tablet Take 1 tablet (5 mg total) by mouth daily. 90 tablet 3  . Ascorbic Acid 500 MG CHEW Chew 1 each by mouth as needed.    . benazepril-hydrochlorthiazide (LOTENSIN HCT) 20-25 MG tablet TAKE ONE TABLET BY MOUTH ONCE DAILY 90 tablet 0  . Cholecalciferol (VITAMIN D3) 2000 UNITS CHEW Chew 1 each by mouth 2 (two) times a week.    Water engineer Bandages & Supports ([employer] LUMBAR/BACK SUPPORT BRACE) MISC 1 Device by Does not apply route daily. 1 each 2  . lovastatin (MEVACOR) 40 MG tablet TAKE ONE TABLET BY MOUTH ONCE DAILY AT BEDTIME 90 tablet 1  . traMADol (ULTRAM) 50 MG tablet Take 1 tablet (50 mg total) by mouth every 8 (eight) hours as needed. 60 tablet 3   No current facility-administered medications on file prior to visit.    Current Medications (verified) Current Outpatient Prescriptions  Medication Sig Dispense Refill  . ALPRAZolam (XANAX) 0.25 MG tablet Take 1 tablet (0.25 mg total) by mouth 2 (two) times daily as needed for anxiety (Dental procedure). 15 tablet 0  . amLODipine (NORVASC) 5 MG tablet Take 1 tablet (5 mg total) by mouth daily.  90 tablet 3  . Ascorbic Acid 500 MG CHEW Chew 1 each by mouth as needed.    . benazepril-hydrochlorthiazide (LOTENSIN HCT) 20-25 MG tablet TAKE ONE TABLET BY MOUTH ONCE DAILY 90 tablet 0  . Cholecalciferol (VITAMIN D3) 2000 UNITS CHEW Chew 1 each by mouth 2 (two) times a week.    Water engineer Bandages & Supports ([employer] LUMBAR/BACK SUPPORT BRACE) MISC 1 Device by Does not apply route daily. 1 each 2  . lovastatin (MEVACOR) 40 MG tablet TAKE ONE TABLET BY MOUTH ONCE DAILY AT BEDTIME 90 tablet 1  . traMADol (ULTRAM) 50 MG tablet Take 1 tablet (50 mg total) by mouth every 8 (eight) hours as needed. 60 tablet 3   No current facility-administered medications for this visit.     Allergies (verified) Meloxicam   PAST HISTORY  Family History Family History  Problem Relation Age of Onset  . Dementia Mother   . Cancer Father     Social History Social History  Substance Use Topics  . Smoking status: Never Smoker   . Smokeless tobacco: Not on file  . Alcohol Use: No     Are there smokers in your home (other than you)? No  Risk Factors Current exercise habits: Home exercise routine includes stretching and walking 20 mins hrs per day.  Dietary issues discussed: None   Cardiac risk factors: hypertension.  Depression Screen (Note: if answer to either of the following is "  Yes", a more complete depression screening is indicated)   Over the past two weeks, have you felt down, depressed or hopeless? No  Over the past two weeks, have you felt little interest or pleasure in doing things? No  Have you lost interest or pleasure in daily life? No  Do you often feel hopeless? No  Do you cry easily over simple problems? No  Activities of Daily Living In your present state of health, do you have any difficulty performing the following activities?:  Driving? No Managing money?  No Feeding yourself? No Getting from bed to chair? NoBP 136/73 mmHg  Pulse 67  Temp(Src) 97 F (36.1 C) (Oral)  Ht 5'  4" (1.626 m)  Wt 162 lb 3.2 oz (73.573 kg)  BMI 27.83 kg/m2  General Appearance:    Alert, cooperative, no distress, appears stated age  Head:    Normocephalic, without obvious abnormality, atraumatic  Eyes:    PERRL, conjunctiva/corneas clear, EOM's intact, fundi    benign, both eyes  Ears:    Normal TM's and external ear canals, both ears  Nose:   Nares normal, septum midline, mucosa normal, no drainage    or sinus tenderness  Throat:   Lips, mucosa, and tongue normal; teeth and gums normal  Neck:   Supple, symmetrical, trachea midline, no adenopathy;    thyroid:  no enlargement/tenderness/nodules; no carotid   bruit or JVD  Back:     Symmetric, no curvature, ROM normal, no CVA tenderness  Lungs:     Clear to auscultation bilaterally, respirations unlabored  Chest Wall:    No tenderness or deformity   Heart:    Regular rate and rhythm, S1 and S2 normal, no murmur, rub   or gallop  Breast Exam:    No tenderness, masses, or nipple abnormality  Abdomen:     Soft, non-tender, bowel sounds active all four quadrants,    no masses, no organomegaly  Genitalia:    Normal female without lesion, discharge or tenderness  Rectal:    Normal tone, normal prostate, no masses or tenderness;   guaiac negative stool  Extremities:   Extremities normal, atraumatic, no cyanosis or edema  Pulses:   2+ and symmetric all extremities  Skin:   Skin color, texture, turgor normal, no rashes or lesions  Lymph nodes:   Cervical, supraclavicular, and axillary nodes normal  Neurologic:   CNII-XII intact, normal strength, sensation and reflexes    throughout   Climbing a flight of stairs? No Preparing food and eating?: No Bathing or showering? No Getting dressed: No Getting to the toilet? No Using the toilet:No Moving around from place to place: No In the past year have you fallen or had a near fall?:No   Are you sexually active?  No  Do you have more than one partner?  No  Hearing Difficulties: No Do  you often ask people to speak up or repeat themselves? No Do you experience ringing or noises in your ears? No Do you have difficulty understanding soft or whispered voices? No   Do you feel that you have a problem with memory? No  Do you often misplace items? No  Do you feel safe at home?  Yes  Cognitive Testing  Alert? Yes  Normal Appearance?Yes  Oriented to person? Yes  Place? Yes   Time? Yes  Recall of three objects?  Yes  Can perform simple calculations? Yes  Displays appropriate judgment?Yes  Can read the correct time from a watch face?Yes  Advanced Directives have been discussed with the patient? Yes  List the Names of Other Physician/Practitioners you currently use: 1.    Indicate any recent Medical Services you may have received from other than [facility] providers in the past year (date may be approximate).  Immunization History  Administered Date(s) Administered  . Influenza,inj,Quad PF,36+ Mos 06/07/2013  . Influenza-Unspecified 04/22/2014, 06/25/2015  . Pneumococcal Conjugate-13 07/16/2015  . Pneumococcal Polysaccharide-23 08/23/2005  . Tdap 08/24/2007    Screening Tests Health Maintenance  Topic Date Due  . DEXA SCAN  01/13/2016 (Originally 10/21/2003)  . ZOSTAVAX  01/13/2016 (Originally 10/20/1998)  . INFLUENZA VACCINE  03/23/2016  . TETANUS/TDAP  08/23/2017  . PNA vac Low Risk Adult  Completed    All answers were reviewed with the patient and necessary referrals were made:  Evelina Dun, FNP   12/16/2015   History reviewed: allergies, current medications, past family history, past medical history, past social history, past surgical history and problem list  Review of Systems A comprehensive review of systems was negative.    Objective:     Vision by Snellen chart: right eye:20/20, left eye:20/20  Body mass index is 27.83 kg/(m^2). BP 136/73 mmHg  Pulse 67  Temp(Src) 97 F (36.1 C) (Oral)  Ht 5\' 4"  (1.626 m)  Wt 162 lb 3.2 oz (73.573 kg)  BMI  27.83 kg/m2  BP 136/73 mmHg  Pulse 67  Temp(Src) 97 F (36.1 C) (Oral)  Ht 5\' 4"  (1.626 m)  Wt 162 lb 3.2 oz (73.573 kg)  BMI 27.83 kg/m2  General Appearance:    Alert, cooperative, no distress, appears stated age  Head:    Normocephalic, without obvious abnormality, atraumatic  Eyes:    PERRL, conjunctiva/corneas clear, EOM's intact, fundi    benign, both eyes  Ears:    Normal TM's and external ear canals, both ears  Nose:   Nares normal, septum midline, mucosa normal, no drainage    or sinus tenderness  Throat:   Lips, mucosa, and tongue normal; teeth and gums normal  Neck:   Supple, symmetrical, trachea midline, no adenopathy;    thyroid:  no enlargement/tenderness/nodules; no carotid   bruit or JVD  Back:     Symmetric, no curvature, ROM normal, no CVA tenderness  Lungs:     Clear to auscultation bilaterally, respirations unlabored  Chest Wall:    No tenderness or deformity   Heart:    Regular rate and rhythm, S1 and S2 normal, no murmur, rub   or gallop  Breast Exam:    No tenderness, masses, or nipple abnormality  Abdomen:     Soft, non-tender, bowel sounds active all four quadrants,    no masses, no organomegaly  Genitalia:    Normal female without lesion, discharge or tenderness  Rectal:    Normal tone, normal prostate, no masses or tenderness;   guaiac negative stool  Extremities:   Extremities normal, atraumatic, no cyanosis or edema  Pulses:   2+ and symmetric all extremities  Skin:   Skin color, texture, turgor normal, no rashes or lesions  Lymph nodes:   Cervical, supraclavicular, and axillary nodes normal  Neurologic:   CNII-XII intact, normal strength, sensation and reflexes    throughout       Assessment:    WNL     Plan:     During the course of the visit the patient was educated and counseled about appropriate screening and preventive services including:    Bone densitometry screening  Zostavax vaccine   *  Pt declines both of these at this time.    Diet review for nutrition referral? Yes ____  Not Indicated __x__   Patient Instructions (the written plan) was given to the patient.  Medicare Attestation I have personally reviewed: The patient's medical and social history Their use of alcohol, tobacco or illicit drugs Their current medications and supplements The patient's functional ability including ADLs,fall risks, home safety risks, cognitive, and hearing and visual impairment Diet and physical activities Evidence for depression or mood disorders  The patient's weight, height, BMI, and visual acuity have been recorded in the chart.  I have made referrals, counseling, and provided education to the patient based on review of the above and I have provided the patient with a written personalized care plan for preventive services.     Evelina Dun, [place]   12/16/2015

## 2015-12-22 ENCOUNTER — Encounter (HOSPITAL_COMMUNITY): Payer: Self-pay | Admitting: *Deleted

## 2015-12-22 ENCOUNTER — Observation Stay (HOSPITAL_COMMUNITY)
Admission: EM | Admit: 2015-12-22 | Discharge: 2015-12-24 | Disposition: A | Discharge status: Home / Self Care | Payer: Medicare Other | Attending: Internal Medicine | Admitting: Internal Medicine

## 2015-12-22 ENCOUNTER — Emergency Department (HOSPITAL_COMMUNITY): Payer: Medicare Other

## 2015-12-22 DIAGNOSIS — R079 Chest pain, unspecified: Secondary | ICD-10-CM | POA: Diagnosis not present

## 2015-12-22 DIAGNOSIS — R112 Nausea with vomiting, unspecified: Secondary | ICD-10-CM | POA: Diagnosis present

## 2015-12-22 DIAGNOSIS — R509 Fever, unspecified: Secondary | ICD-10-CM | POA: Diagnosis not present

## 2015-12-22 DIAGNOSIS — R0602 Shortness of breath: Secondary | ICD-10-CM | POA: Diagnosis not present

## 2015-12-22 DIAGNOSIS — Z9189 Other specified personal risk factors, not elsewhere classified: Secondary | ICD-10-CM

## 2015-12-22 DIAGNOSIS — I10 Essential (primary) hypertension: Secondary | ICD-10-CM | POA: Diagnosis not present

## 2015-12-22 DIAGNOSIS — E785 Hyperlipidemia, unspecified: Secondary | ICD-10-CM | POA: Diagnosis present

## 2015-12-22 DIAGNOSIS — R0682 Tachypnea, not elsewhere classified: Secondary | ICD-10-CM | POA: Diagnosis not present

## 2015-12-22 DIAGNOSIS — R51 Headache: Secondary | ICD-10-CM | POA: Diagnosis not present

## 2015-12-22 DIAGNOSIS — M791 Myalgia, unspecified site: Secondary | ICD-10-CM

## 2015-12-22 DIAGNOSIS — R197 Diarrhea, unspecified: Secondary | ICD-10-CM | POA: Insufficient documentation

## 2015-12-22 DIAGNOSIS — W57XXXA Bitten or stung by nonvenomous insect and other nonvenomous arthropods, initial encounter: Secondary | ICD-10-CM

## 2015-12-22 DIAGNOSIS — Z79899 Other long term (current) drug therapy: Secondary | ICD-10-CM | POA: Diagnosis not present

## 2015-12-22 DIAGNOSIS — R5383 Other fatigue: Secondary | ICD-10-CM | POA: Diagnosis present

## 2015-12-22 MED ORDER — SODIUM CHLORIDE 0.9 % IV BOLUS (SEPSIS)
1000.0000 mL | Freq: Once | INTRAVENOUS | Status: AC
  Administered 2015-12-23: 1000 mL via INTRAVENOUS

## 2015-12-22 MED ORDER — ONDANSETRON HCL 4 MG/2ML IJ SOLN
4.0000 mg | Freq: Once | INTRAMUSCULAR | Status: AC
  Administered 2015-12-23: 4 mg via INTRAVENOUS
  Filled 2015-12-22: qty 2

## 2015-12-22 NOTE — ED Provider Notes (Signed)
CSN: [id number]     Arrival date & time 12/22/15  2250 History  By signing my name below, I, Belinda Scott, attest that this documentation has been prepared under the direction and in the presence of Belinda Falconer, MD at 2337 PM. Electronically Signed: Doran Scott, ED Scribe. 12/23/2015. 11:42 PM.   Chief Complaint  Patient presents with  . Fatigue   The history is provided by the patient. No language interpreter was used.   HPI Comments: Belinda Scott is a 75 y.o. female who presents to the Emergency Department with a PMHx of HTN, HLD and chronic back pain complaining of a sudden diffuse HA that has been intermittent for the past 2 days. Pt also reports fever Tmax 102 F the past two nights, chills, mild SOB tonight (possibly nerves), nausea, diarrhea x3 on April 30 and once today, May 1, mild diffuse, achy abdominal pain, and increased urination. She states her HA suddenly began as she was getting ready for bed 2 days ago  Her HA is aggravated by lights and sounds and the presence of fever. Pt denies relief of her HA by positional changes. Pt states her HA is still present while in the ED but less severe. Pt denies any vomiting, wheezing, coughing, CP, sore throat, rhinorrhea, dysuria, hematuria, Visual changes, numbness or tingling in her extremities other than pins and needle sensation she's had in her legs and leg cramps that she's had for the past several months. She states she started feeling better on April 30 but then started having diarrhea.. She stated she felt better about 2 PM this afternoon. Pt reports possible sick contacts at the [employer] where she works.She reports she took a tick off her right knee on April 29. Pt does not smoke or drink. Pt lives with her husband  Pt initially denies CP however towards the end of the encounter she reported central CP although denying that several times before. At the end of her exam she stated she just hurt all over and took her hands indicated her  chest and her abdomen. History is found to be inconsistent   Pt is followed by Dr. Lenna Scott.   Past Medical History  Diagnosis Date  . Hypertension   . Hyperlipidemia   . Low back pain    Past Surgical History  Procedure Laterality Date  . Ovarian cyst surgery    . Appendectomy    . Cholecystectomy    . Tonsillectomy    . Abdominal hysterectomy     Family History  Problem Relation Age of Onset  . Dementia Mother   . Cancer Father    Social History  Substance Use Topics  . Smoking status: Never Smoker   . Smokeless tobacco: None  . Alcohol Use: No   Lives at home Lives with spouse  OB History    No data available     Review of Systems  Constitutional: Positive for fever and chills.  HENT: Negative for rhinorrhea and sore throat.   Respiratory: Positive for shortness of breath. Negative for cough and wheezing.   Cardiovascular: Positive for chest pain.  Gastrointestinal: Positive for nausea, abdominal pain and diarrhea. Negative for vomiting.  Genitourinary: Positive for frequency. Negative for dysuria and hematuria.  All other systems reviewed and are negative.   Allergies  Meloxicam  Home Medications   Prior to Admission medications   Medication Sig Start Date End Date Taking? Authorizing Provider  ALPRAZolam (XANAX) 0.25 MG tablet Take 1 tablet (0.25 mg  total) by mouth 2 (two) times daily as needed for anxiety (Dental procedure). 10/30/15  Yes Sharion Balloon, FNP  amLODipine (NORVASC) 5 MG tablet Take 1 tablet (5 mg total) by mouth daily. 04/10/15  Yes Sharion Balloon, FNP  Ascorbic Acid 500 MG CHEW Chew 1 each by mouth daily.    Yes Historical Provider, MD  benazepril-hydrochlorthiazide (LOTENSIN HCT) 20-25 MG tablet TAKE ONE TABLET BY MOUTH ONCE DAILY 10/23/15  Yes Sharion Balloon, FNP  Cholecalciferol (VITAMIN D3) 2000 UNITS CHEW Chew 1 each by mouth 2 (two) times a week.   Yes Historical Provider, MD  Elastic Bandages & Supports ([employer] LUMBAR/BACK SUPPORT BRACE)  MISC 1 Device by Does not apply route daily. 04/08/15  Yes Sharion Balloon, FNP  lovastatin (MEVACOR) 40 MG tablet TAKE ONE TABLET BY MOUTH ONCE DAILY AT BEDTIME 12/11/15  Yes Sharion Balloon, FNP  penicillin v potassium (VEETID) 500 MG tablet Take 500 mg by mouth 4 (four) times daily. 7 day course for dental starting on 12/17/15 12/17/15  Yes Historical Provider, MD  traMADol (ULTRAM) 50 MG tablet Take 1 tablet (50 mg total) by mouth every 8 (eight) hours as needed. Patient taking differently: Take 50 mg by mouth every 8 (eight) hours as needed for moderate pain.  10/30/15  Yes Christy A Hawks, FNP   BP 127/88 mmHg  Pulse 78  Temp(Src) 99.3 F (37.4 C) (Oral)  Resp 18  Ht 5\' 4"  (1.626 m)  Wt 155 lb (70.308 kg)  BMI 26.59 kg/m2  SpO2 100%  Vital signs normal except for low-grade temp    Physical Exam  Constitutional: She is oriented to person, place, and time. She appears well-developed and well-nourished.  Non-toxic appearance. She does not appear ill. No distress.  HENT:  Head: Normocephalic and atraumatic.  Right Ear: External ear normal.  Left Ear: External ear normal.  Nose: Nose normal. No mucosal edema or rhinorrhea.  Mouth/Throat: Oropharynx is clear and moist. Mucous membranes are dry. No dental abscesses or uvula swelling.  Eyes: Conjunctivae and EOM are normal. Pupils are equal, round, and reactive to light.  Neck: Normal range of motion and full passive range of motion without pain. Neck supple.  Cardiovascular: Normal rate, regular rhythm and normal heart sounds.  Exam reveals no gallop and no friction rub.   No murmur heard. Pulmonary/Chest: Effort normal and breath sounds normal. No respiratory distress. She has no wheezes. She has no rhonchi. She has no rales. She exhibits no tenderness and no crepitus.  Abdominal: Soft. Normal appearance and bowel sounds are normal. She exhibits no distension. There is no tenderness. There is no rebound and no guarding.  Musculoskeletal:  Normal range of motion. She exhibits no edema or tenderness.  Moves all extremities well.   Neurological: She is alert and oriented to person, place, and time. She has normal strength. No cranial nerve deficit.  Skin: Skin is warm, dry and intact. No rash noted. No erythema. No pallor.  Small red area on her medial proximal lower leg where she states she removed a tick 2 days ago.   Psychiatric: She has a normal mood and affect. Her speech is normal and behavior is normal. Her mood appears not anxious.  Nursing note and vitals reviewed.  ED Course  Procedures   Medications  doxycycline (VIBRAMYCIN) 100 mg in dextrose 5 % 250 mL IVPB (100 mg Intravenous New Bag/Given 12/23/15 0232)  ondansetron (ZOFRAN) injection 4 mg (4 mg Intravenous Given 12/23/15 0015)  sodium chloride 0.9 % bolus 1,000 mL (1,000 mLs Intravenous New Bag/Given 12/23/15 0016)  sodium chloride 0.9 % bolus 1,000 mL (0 mLs Intravenous Stopped 12/23/15 0120)  potassium chloride SA (K-DUR,KLOR-CON) CR tablet 40 mEq (40 mEq Oral Given 12/23/15 0156)  ondansetron (ZOFRAN) injection 4 mg (4 mg Intravenous Given 12/23/15 0232)    DIAGNOSTIC STUDIES: Oxygen Saturation is 100% on room air, normal by my interpretation.    COORDINATION OF CARE: 11:28 PM Will order CXR, EKG, blood work, urinalysis, and urine culture. Discussed treatment plan with pt at bedside and pt agreed to plan.  1:10 AM after reviewing patient's laboratory results she was started on oral potassium for her low potassium. All of her tests are normal and at this point I felt her symptoms may be best described as a tick borne illness or viral illness. She was started on doxycycline.  Patient was rechecked at 1:50 AM. She states she still has nausea. We discussed whether she thought she could go home or she should stay in the hospital. However due to her continued nausea she is agreeable for admission for observation to make sure she'll be able to tolerate her oral  antibiotics.  02:13 Dr Marin Comment, hospitalist, will see patient and admit.    Labs Review Results for orders placed or performed during the hospital encounter of 12/22/15  Culture, blood (routine x 2)  Result Value Ref Range   Specimen Description BLOOD RIGHT ANTECUBITAL    Special Requests BOTTLES DRAWN AEROBIC AND ANAEROBIC 4CC EACH    Culture PENDING    Report Status PENDING   Culture, blood (routine x 2)  Result Value Ref Range   Specimen Description BLOOD LEFT ANTECUBITAL    Special Requests BOTTLES DRAWN AEROBIC AND ANAEROBIC 5CC EACH    Culture PENDING    Report Status PENDING   Comprehensive metabolic panel  Result Value Ref Range   Sodium 141 135 - 145 mmol/L   Potassium 3.1 (L) 3.5 - 5.1 mmol/L   Chloride 102 101 - 111 mmol/L   CO2 26 22 - 32 mmol/L   Glucose, Bld 127 (H) 65 - 99 mg/dL   BUN 16 6 - 20 mg/dL   Creatinine, Ser 0.86 0.44 - 1.00 mg/dL   Calcium 9.2 8.9 - 10.3 mg/dL   Total Protein 8.0 6.5 - 8.1 g/dL   Albumin 4.1 3.5 - 5.0 g/dL   AST 28 15 - 41 U/L   ALT 23 14 - 54 U/L   Alkaline Phosphatase 59 38 - 126 U/L   Total Bilirubin 1.1 0.3 - 1.2 mg/dL   GFR calc non Af Amer >60 >60 mL/min   GFR calc Af Amer >60 >60 mL/min   Anion gap 13 5 - 15  CBC with Differential  Result Value Ref Range   WBC 8.1 4.0 - 10.5 K/uL   RBC 4.89 3.87 - 5.11 MIL/uL   Hemoglobin 15.0 12.0 - 15.0 g/dL   HCT 42.9 36.0 - 46.0 %   MCV 87.7 78.0 - 100.0 fL   MCH 30.7 26.0 - 34.0 pg   MCHC 35.0 30.0 - 36.0 g/dL   RDW 12.7 11.5 - 15.5 %   Platelets 180 150 - 400 K/uL   Neutrophils Relative % 70 %   Neutro Abs 5.6 1.7 - 7.7 K/uL   Lymphocytes Relative 24 %   Lymphs Abs 2.0 0.7 - 4.0 K/uL   Monocytes Relative 6 %   Monocytes Absolute 0.5 0.1 - 1.0 K/uL   Eosinophils  Relative 0 %   Eosinophils Absolute 0.0 0.0 - 0.7 K/uL   Basophils Relative 0 %   Basophils Absolute 0.0 0.0 - 0.1 K/uL  Urinalysis, Routine w reflex microscopic  Result Value Ref Range   Color, Urine YELLOW YELLOW    APPearance CLEAR CLEAR   Specific Gravity, Urine 1.010 1.005 - 1.030   pH 7.5 5.0 - 8.0   Glucose, UA NEGATIVE NEGATIVE mg/dL   Hgb urine dipstick NEGATIVE NEGATIVE   Bilirubin Urine NEGATIVE NEGATIVE   Ketones, ur >80 (A) NEGATIVE mg/dL   Protein, ur NEGATIVE NEGATIVE mg/dL   Nitrite NEGATIVE NEGATIVE   Leukocytes, UA NEGATIVE NEGATIVE  Lipase, blood  Result Value Ref Range   Lipase 31 11 - 51 U/L  Troponin I  Result Value Ref Range   Troponin I <0.03 <0.031 ng/mL   Laboratory interpretation all normal except Hypokalemia     Imaging Review Dg Chest 2 View  12/23/2015  CLINICAL DATA:  Sudden onset of diffuse headache 2 days ago. Central chest pain. Fever. EXAM: CHEST  2 VIEW COMPARISON:  None. FINDINGS: There is moderate right hemidiaphragm elevation. The lungs are clear. The pulmonary vasculature is normal. There is no pleural effusion. IMPRESSION: No active cardiopulmonary disease. Electronically Signed   By: Andreas Newport M.D.   On: 12/23/2015 01:31   I have personally reviewed and evaluated these images and lab results as part of my medical decision-making.   EKG Interpretation  #1 Date/Time:  Tuesday Dec 23 2015 00:12:28 EDT Ventricular Rate:  72 PR Interval:  159 QRS Duration: 127 QT Interval:  439 QTC Calculation: 480 R Axis:   -79 Text Interpretation:  Sinus rhythm Ventricular premature complex  Nonspecific IVCD with LAD Nonspecific T abnormalities, inferior leads  Baseline wander Confirmed by Deshae Dickison  MD-I, Orlando Thalmann ([id number]) on 12/23/2015 1:04:52  AM   #2  EKG Interpretation  Date/Time:  Tuesday Dec 23 2015 00:21:18 EDT Ventricular Rate:  70 PR Interval:  162 QRS Duration: 127 QT Interval:  443 QTC Calculation: 478 R Axis:   -72 Text Interpretation:  Sinus rhythm Ventricular premature complex Nonspecific IVCD with LAD No significant change since last tracing 21 Aug 2009 Confirmed by Cheronda Erck  MD-I, Bode Pieper ([id number]) on 12/23/2015 1:06:16 AM          MDM   Final  diagnoses:  Fever, unspecified fever cause  Myalgia  At high risk for tick borne illness    Plan admission  Rolland Porter, MD, Barbette Or, MD 12/23/15 [phone]

## 2015-12-22 NOTE — ED Notes (Signed)
Pt states started feeling bad on Sat. thought was getting better today but worse tonight. Pt states fever tonight.

## 2015-12-23 ENCOUNTER — Emergency Department (HOSPITAL_COMMUNITY): Payer: Medicare Other

## 2015-12-23 ENCOUNTER — Encounter (HOSPITAL_COMMUNITY): Payer: Self-pay | Admitting: Internal Medicine

## 2015-12-23 DIAGNOSIS — E785 Hyperlipidemia, unspecified: Secondary | ICD-10-CM

## 2015-12-23 DIAGNOSIS — W57XXXA Bitten or stung by nonvenomous insect and other nonvenomous arthropods, initial encounter: Secondary | ICD-10-CM

## 2015-12-23 DIAGNOSIS — R112 Nausea with vomiting, unspecified: Secondary | ICD-10-CM | POA: Diagnosis present

## 2015-12-23 DIAGNOSIS — I10 Essential (primary) hypertension: Secondary | ICD-10-CM

## 2015-12-23 DIAGNOSIS — R079 Chest pain, unspecified: Secondary | ICD-10-CM | POA: Diagnosis not present

## 2015-12-23 LAB — CBC WITH DIFFERENTIAL/PLATELET
Basophils Absolute: 0 10*3/uL (ref 0.0–0.1)
Basophils Relative: 0 %
EOS ABS: 0 10*3/uL (ref 0.0–0.7)
EOS PCT: 0 %
HCT: 42.9 % (ref 36.0–46.0)
Hemoglobin: 15 g/dL (ref 12.0–15.0)
LYMPHS ABS: 2 10*3/uL (ref 0.7–4.0)
Lymphocytes Relative: 24 %
MCH: 30.7 pg (ref 26.0–34.0)
MCHC: 35 g/dL (ref 30.0–36.0)
MCV: 87.7 fL (ref 78.0–100.0)
MONOS PCT: 6 %
Monocytes Absolute: 0.5 10*3/uL (ref 0.1–1.0)
Neutro Abs: 5.6 10*3/uL (ref 1.7–7.7)
Neutrophils Relative %: 70 %
PLATELETS: 180 10*3/uL (ref 150–400)
RBC: 4.89 MIL/uL (ref 3.87–5.11)
RDW: 12.7 % (ref 11.5–15.5)
WBC: 8.1 10*3/uL (ref 4.0–10.5)

## 2015-12-23 LAB — COMPREHENSIVE METABOLIC PANEL
ALK PHOS: 59 U/L (ref 38–126)
ALT: 23 U/L (ref 14–54)
AST: 28 U/L (ref 15–41)
Albumin: 4.1 g/dL (ref 3.5–5.0)
Anion gap: 13 (ref 5–15)
BUN: 16 mg/dL (ref 6–20)
CHLORIDE: 102 mmol/L (ref 101–111)
CO2: 26 mmol/L (ref 22–32)
CREATININE: 0.86 mg/dL (ref 0.44–1.00)
Calcium: 9.2 mg/dL (ref 8.9–10.3)
GFR calc Af Amer: >60 mL/min (ref >60)
Glucose, Bld: 127 mg/dL — ABNORMAL HIGH (ref 65–99)
Potassium: 3.1 mmol/L — ABNORMAL LOW (ref 3.5–5.1)
SODIUM: 141 mmol/L (ref 135–145)
Total Bilirubin: 1.1 mg/dL (ref 0.3–1.2)
Total Protein: 8 g/dL (ref 6.5–8.1)

## 2015-12-23 LAB — LIPASE, BLOOD: LIPASE: 31 U/L (ref 11–51)

## 2015-12-23 LAB — URINALYSIS, ROUTINE W REFLEX MICROSCOPIC
Bilirubin Urine: NEGATIVE
Glucose, UA: NEGATIVE mg/dL
HGB URINE DIPSTICK: NEGATIVE
Ketones, ur: >80 mg/dL — AB
LEUKOCYTES UA: NEGATIVE
Nitrite: NEGATIVE
PROTEIN: NEGATIVE mg/dL
Specific Gravity, Urine: 1.01 (ref 1.005–1.030)
pH: 7.5 (ref 5.0–8.0)

## 2015-12-23 LAB — TROPONIN I: Troponin I: <0.03 ng/mL (ref <0.031)

## 2015-12-23 MED ORDER — ONDANSETRON HCL 4 MG/2ML IJ SOLN
4.0000 mg | Freq: Once | INTRAMUSCULAR | Status: AC
  Administered 2015-12-23: 4 mg via INTRAVENOUS
  Filled 2015-12-23: qty 2

## 2015-12-23 MED ORDER — DOXYCYCLINE HYCLATE 100 MG IV SOLR
INTRAVENOUS | Status: AC
  Filled 2015-12-23: qty 100

## 2015-12-23 MED ORDER — ACETAMINOPHEN 650 MG RE SUPP: 650.0000 mg | Freq: Four times a day (QID) | RECTAL | Status: DC | PRN

## 2015-12-23 MED ORDER — ACETAMINOPHEN 325 MG PO TABS
650.0000 mg | ORAL_TABLET | Freq: Four times a day (QID) | ORAL | Status: DC | PRN
  Administered 2015-12-23 – 2015-12-24 (×2): 650 mg via ORAL
  Filled 2015-12-23 (×2): qty 2

## 2015-12-23 MED ORDER — AMLODIPINE BESYLATE 5 MG PO TABS
5.0000 mg | ORAL_TABLET | Freq: Every day | ORAL | Status: DC
  Administered 2015-12-23 – 2015-12-24 (×2): 5 mg via ORAL
  Filled 2015-12-23 (×2): qty 1

## 2015-12-23 MED ORDER — DOXYCYCLINE HYCLATE 100 MG IV SOLR
100.0000 mg | Freq: Two times a day (BID) | INTRAVENOUS | Status: DC
  Administered 2015-12-23 – 2015-12-24 (×3): 100 mg via INTRAVENOUS
  Filled 2015-12-23 (×6): qty 100

## 2015-12-23 MED ORDER — LORATADINE 10 MG PO TABS
10.0000 mg | ORAL_TABLET | Freq: Every day | ORAL | Status: DC
  Administered 2015-12-23 – 2015-12-24 (×2): 10 mg via ORAL
  Filled 2015-12-23 (×2): qty 1

## 2015-12-23 MED ORDER — PRAVASTATIN SODIUM 10 MG PO TABS
10.0000 mg | ORAL_TABLET | Freq: Every day | ORAL | Status: DC
  Administered 2015-12-23: 10 mg via ORAL
  Filled 2015-12-23: qty 1

## 2015-12-23 MED ORDER — POTASSIUM CHLORIDE IN NACL 40-0.9 MEQ/L-% IV SOLN
INTRAVENOUS | Status: DC
  Administered 2015-12-23 (×2): 75 mL/h via INTRAVENOUS

## 2015-12-23 MED ORDER — ONDANSETRON HCL 4 MG/2ML IJ SOLN: 4.0000 mg | Freq: Four times a day (QID) | INTRAMUSCULAR | Status: DC | PRN

## 2015-12-23 MED ORDER — ONDANSETRON HCL 4 MG PO TABS: 4.0000 mg | ORAL_TABLET | Freq: Four times a day (QID) | ORAL | Status: DC | PRN

## 2015-12-23 MED ORDER — ALPRAZOLAM 0.25 MG PO TABS: 0.2500 mg | ORAL_TABLET | Freq: Two times a day (BID) | ORAL | Status: DC | PRN

## 2015-12-23 MED ORDER — DEXTROSE-NACL 5-0.9 % IV SOLN
INTRAVENOUS | Status: DC
  Administered 2015-12-23: 05:00:00 via INTRAVENOUS

## 2015-12-23 MED ORDER — DOXYCYCLINE HYCLATE 100 MG IV SOLR: 100.0000 mg | Freq: Two times a day (BID) | INTRAVENOUS | Status: DC

## 2015-12-23 MED ORDER — POTASSIUM CHLORIDE CRYS ER 20 MEQ PO TBCR
40.0000 meq | EXTENDED_RELEASE_TABLET | Freq: Once | ORAL | Status: AC
  Administered 2015-12-23: 40 meq via ORAL
  Filled 2015-12-23: qty 2

## 2015-12-23 NOTE — Progress Notes (Signed)
Patient is a 60-yo w/ a history of HTN, who was admitted for headache, fever, diarrhea by Dr. Marin Comment this morning.. At home, the patient removed a tick on 12/21/15. She is presumed to have a tickborne illness/viral syndrome. Potassium was 3.1 on admission. She was started on supportive treatment and IV doxycycline. [facility] spotted fever and Lyme disease titers were ordered.  -We'll add potassium chloride to the IV fluids. Patient has less nausea and diarrhea, so we will advance her diet to full liquids. -If she is symptomatically improved tomorrow, consider discharge with outpatient follow-up of serologies.

## 2015-12-23 NOTE — H&P (Signed)
History and Physical    Belinda Scott [id number] DOB: 1941-03-26 DOA: 12/22/2015  Referring MD/NP/PA: Dr. Rolland Porter PCP: Evelina Dun, FNP  Outpatient Specialists:  Patient coming from: Home   Chief Complaint: Fatigue  HPI: Belinda Scott is a 75 y.o. female with medical history significant of HTN, HLD, and chronic back pain presented with complaints of sudden intermittent headache  Onset 4/29. Patient also reported fever of 102 chills, mild SOB, nausea, diarrhea, and mild abdominal pain. Patient reported that she recently removed a tick 4/30. She is unaware of how long the tic was on her body, but it was not engorged.  ED Course: Potassium 3.1, otherwise CMP unremarkable. All other labs unremarkable. CXR without acute disease. She has been referred for admission for overnight observation and IV abx.   Review of Systems: As per HPI otherwise 10 point review of systems negative.    Past Medical History  Diagnosis Date  . Hypertension   . Hyperlipidemia   . Low back pain     Past Surgical History  Procedure Laterality Date  . Ovarian cyst surgery    . Appendectomy    . Cholecystectomy    . Tonsillectomy    . Abdominal hysterectomy       reports that she has never smoked. She does not have any smokeless tobacco history on file. She reports that she does not drink alcohol or use illicit drugs.  Allergies  Allergen Reactions  . Meloxicam Other (See Comments)    Heart did not feel right.    Family History  Problem Relation Age of Onset  . Dementia Mother   . Cancer Father      Prior to Admission medications   Medication Sig Start Date End Date Taking? Authorizing Provider  ALPRAZolam (XANAX) 0.25 MG tablet Take 1 tablet (0.25 mg total) by mouth 2 (two) times daily as needed for anxiety (Dental procedure). 10/30/15  Yes Sharion Balloon, FNP  amLODipine (NORVASC) 5 MG tablet Take 1 tablet (5 mg total) by mouth daily. 04/10/15  Yes Sharion Balloon, FNP  Ascorbic Acid 500  MG CHEW Chew 1 each by mouth daily.    Yes Historical Provider, MD  benazepril-hydrochlorthiazide (LOTENSIN HCT) 20-25 MG tablet TAKE ONE TABLET BY MOUTH ONCE DAILY 10/23/15  Yes Sharion Balloon, FNP  Cholecalciferol (VITAMIN D3) 2000 UNITS CHEW Chew 1 each by mouth 2 (two) times a week.   Yes Historical Provider, MD  Elastic Bandages & Supports ([employer] LUMBAR/BACK SUPPORT BRACE) MISC 1 Device by Does not apply route daily. 04/08/15  Yes Sharion Balloon, FNP  lovastatin (MEVACOR) 40 MG tablet TAKE ONE TABLET BY MOUTH ONCE DAILY AT BEDTIME 12/11/15  Yes Sharion Balloon, FNP  penicillin v potassium (VEETID) 500 MG tablet Take 500 mg by mouth 4 (four) times daily. 7 day course for dental starting on 12/17/15 12/17/15  Yes Historical Provider, MD  traMADol (ULTRAM) 50 MG tablet Take 1 tablet (50 mg total) by mouth every 8 (eight) hours as needed. Patient taking differently: Take 50 mg by mouth every 8 (eight) hours as needed for moderate pain.  10/30/15  Yes Sharion Balloon, FNP    Physical Exam: Filed Vitals:   12/22/15 2255 12/23/15 0030  BP: 127/88 117/93  Pulse: 78 71  Temp: 99.3 F (37.4 C)   TempSrc: Oral   Resp: 18 12  Height: 5\' 4"  (1.626 m)   Weight: 70.308 kg (155 lb)   SpO2: 100% 100%  Constitutional: NAD, calm, comfortable Filed Vitals:   12/22/15 2255 12/23/15 0030  BP: 127/88 117/93  Pulse: 78 71  Temp: 99.3 F (37.4 C)   TempSrc: Oral   Resp: 18 12  Height: 5\' 4"  (1.626 m)   Weight: 70.308 kg (155 lb)   SpO2: 100% 100%   Eyes: PERRL, lids and conjunctivae normal ENMT: Mucous membranes are moist. Posterior pharynx clear of any exudate or lesions.Normal dentition.  Neck: normal, supple, no masses, no thyromegaly Respiratory: clear to auscultation bilaterally, no wheezing, no crackles. Normal respiratory effort. No accessory muscle use.  Cardiovascular: Regular rate and rhythm, no murmurs / rubs / gallops. No extremity edema. 2+ pedal pulses. No carotid bruits.  Abdomen:  no tenderness, no masses palpated. No hepatosplenomegaly. Bowel sounds positive.  Musculoskeletal: no clubbing / cyanosis. No joint deformity upper and lower extremities. Good ROM, no contractures. Normal muscle tone.  Skin: no rashes, lesions, ulcers. No induration Neurologic: CN 2-12 grossly intact. Sensation intact, DTR normal. Strength 5/5 in all 4.  Psychiatric: Normal judgment and insight. Alert and oriented x 3. Normal mood.   Labs on Admission: I have personally reviewed following labs and imaging studies  CBC:  Recent Labs Lab 12/22/15 2359  WBC 8.1  NEUTROABS 5.6  HGB 15.0  HCT 42.9  MCV 87.7  PLT 99991111   Basic Metabolic Panel:  Recent Labs Lab 12/22/15 2359  NA 141  K 3.1*  CL 102  CO2 26  GLUCOSE 127*  BUN 16  CREATININE 0.86  CALCIUM 9.2   GFR: Estimated Creatinine Clearance: 52.7 mL/min (by C-G formula based on Cr of 0.86). Liver Function Tests:  Recent Labs Lab 12/22/15 2359  AST 28  ALT 23  ALKPHOS 59  BILITOT 1.1  PROT 8.0  ALBUMIN 4.1    Recent Labs Lab 12/22/15 2359  LIPASE 31    Recent Labs Lab 12/22/15 2359  TROPONINI <0.03   Urine analysis:    Component Value Date/Time   COLORURINE YELLOW 12/22/2015 2329   APPEARANCEUR CLEAR 12/22/2015 2329   LABSPEC 1.010 12/22/2015 2329   PHURINE 7.5 12/22/2015 2329   GLUCOSEU NEGATIVE 12/22/2015 2329   HGBUR NEGATIVE 12/22/2015 2329   BILIRUBINUR NEGATIVE 12/22/2015 2329   KETONESUR >80* 12/22/2015 2329   PROTEINUR NEGATIVE 12/22/2015 2329   UROBILINOGEN 0.2 12/16/2009 0740   NITRITE NEGATIVE 12/22/2015 2329   LEUKOCYTESUR NEGATIVE 12/22/2015 2329   Sepsis Labs:  Recent Results (from the past 240 hour(s))  Culture, blood (routine x 2)     Status: None (Preliminary result)   Collection Time: 12/22/15 11:59 PM  Result Value Ref Range Status   Specimen Description BLOOD RIGHT ANTECUBITAL  Final   Special Requests BOTTLES DRAWN AEROBIC AND ANAEROBIC 4CC EACH  Final   Culture  PENDING  Incomplete   Report Status PENDING  Incomplete  Culture, blood (routine x 2)     Status: None (Preliminary result)   Collection Time: 12/23/15 12:20 AM  Result Value Ref Range Status   Specimen Description BLOOD LEFT ANTECUBITAL  Final   Special Requests BOTTLES DRAWN AEROBIC AND ANAEROBIC 5CC EACH  Final   Culture PENDING  Incomplete   Report Status PENDING  Incomplete     Radiological Exams on Admission: Dg Chest 2 View  12/23/2015  CLINICAL DATA:  Sudden onset of diffuse headache 2 days ago. Central chest pain. Fever. EXAM: CHEST  2 VIEW COMPARISON:  None. FINDINGS: There is moderate right hemidiaphragm elevation. The lungs are clear. The pulmonary vasculature is  normal. There is no pleural effusion. IMPRESSION: No active cardiopulmonary disease. Electronically Signed   By: Andreas Newport M.D.   On: 12/23/2015 01:31    EKG: Independently reviewed.   Assessment/Plan   1. Viral syndrome, possible tic bourne illness with associated nausea and headache. Patient reports removing a deer tick on 4/30. Tic was not engorged and she does not know how long the tic was on her body.  No rash suspicious of lyme disease. I think her risk is low, but possible.  Will started on IV doxycycline. Will transition to oral in the am when she is able to take oral meds. Blood cultures were obtained.  Will get RMSF and Lyme titers.  2. Essential HTN, continue home medications.  3. HLD continue statin.   DVT prophylaxis: SCDs Code Status: Full Family Communication:  Husband bedside.  Disposition Plan: Anticipate discharge in 24 hours Consults called: None Admission status: Admit to medical bed for observation   Orvan Falconer MD  FACP. [facility]   If 7PM-7AM, please contact night-coverage [email or web address] Password [email or web address]  12/23/2015, 2:13 AM    By signing my name below, I, Rennis Harding, attest that this documentation has been prepared under the direction and in the presence of Orvan Falconer, MD. Electronically signed: Rennis Harding, Scribe. 12/23/2015 2:28am   I, Dr. Orvan Falconer, personally performed the services described in this documentation. All medical record entries made by the scribe were at my discretion and in my presence. Orvan Falconer, MD 12/23/2015

## 2015-12-23 NOTE — Care Management Obs Status (Signed)
[facility] NOTIFICATION   Patient Details  Name: Belinda Scott MRN: [id number] Date of Birth: [birth year]   Medicare Observation Status Notification Given:  Yes    Alvie Heidelberg, RN 12/23/2015, 2:52 PM

## 2015-12-23 NOTE — ED Notes (Signed)
Patient ambulated to the restroom with minimal assistance 

## 2015-12-23 NOTE — Care Management Note (Signed)
Case Management Note  Patient Details  Name: Belinda Scott MRN: [id number] Date of Birth: 1941-01-08  Subjective/Objective:  Spoke with patient for discharge planning. From home with husband of 24 years. Drives and ambulates with a cane mostly but has a walker. No home O2 or other DME.  Patient denies any issues obtaining home medications.                  Action/Plan:Home with Self care.   Expected Discharge Date:  12/24/15               Expected Discharge Plan:  Home/Self Care  In-House Referral:     Discharge planning Services  CM Consult  Post Acute Care Choice:  NA Choice offered to:  NA  DME Arranged:    DME Agency:     HH Arranged:    HH Agency:     Status of Service:  Completed, signed off  Medicare Important Message Given:    Date Medicare IM Given:    Medicare IM give by:    Date Additional Medicare IM Given:    Additional Medicare Important Message give by:     If discussed at [place] of Stay Meetings, dates discussed:    Additional Comments:  Alvie Heidelberg, RN 12/23/2015, 3:06 PM

## 2015-12-24 DIAGNOSIS — W57XXXA Bitten or stung by nonvenomous insect and other nonvenomous arthropods, initial encounter: Secondary | ICD-10-CM

## 2015-12-24 DIAGNOSIS — T148 Other injury of unspecified body region: Secondary | ICD-10-CM

## 2015-12-24 DIAGNOSIS — R112 Nausea with vomiting, unspecified: Secondary | ICD-10-CM

## 2015-12-24 LAB — BASIC METABOLIC PANEL
ANION GAP: 7 (ref 5–15)
BUN: 8 mg/dL (ref 6–20)
CALCIUM: 8.4 mg/dL — AB (ref 8.9–10.3)
CO2: 25 mmol/L (ref 22–32)
Chloride: 112 mmol/L — ABNORMAL HIGH (ref 101–111)
Creatinine, Ser: 0.76 mg/dL (ref 0.44–1.00)
Glucose, Bld: 96 mg/dL (ref 65–99)
POTASSIUM: 3.6 mmol/L (ref 3.5–5.1)
Sodium: 144 mmol/L (ref 135–145)

## 2015-12-24 LAB — CBC
HCT: 36.9 % (ref 36.0–46.0)
HEMOGLOBIN: 12.5 g/dL (ref 12.0–15.0)
MCH: 30.4 pg (ref 26.0–34.0)
MCHC: 33.9 g/dL (ref 30.0–36.0)
MCV: 89.8 fL (ref 78.0–100.0)
Platelets: 157 10*3/uL (ref 150–400)
RBC: 4.11 MIL/uL (ref 3.87–5.11)
RDW: 12.8 % (ref 11.5–15.5)
WBC: 6 10*3/uL (ref 4.0–10.5)

## 2015-12-24 LAB — URINE CULTURE: Culture: 60000 — AB

## 2015-12-24 LAB — ROCKY MTN SPOTTED FVR ABS PNL(IGG+IGM)
RMSF IGM: 0.66 {index} (ref 0.00–0.89)
RMSF IgG: NEGATIVE

## 2015-12-24 MED ORDER — DOXYCYCLINE HYCLATE 100 MG PO CAPS: 100.0000 mg | ORAL_CAPSULE | Freq: Two times a day (BID) | ORAL | Status: DC

## 2015-12-24 NOTE — Discharge Summary (Signed)
Physician Discharge Summary  Belinda Scott [id number] DOB: [birth year] DOA: 12/22/2015  PCP: Evelina Dun, FNP  Admit date: 12/22/2015 Discharge date: 12/24/2015  Time spent: 35 minutes  Recommendations for Outpatient Follow-up:  1. Follow up with PCP in 1-2 weeks 2. Will be discharged with a course of Doxycycline 3. Follow up RMSF antibodies   Discharge Diagnoses:  Principal Problem:   Nausea and vomiting Active Problems:   Essential hypertension   HLD (hyperlipidemia)   Tick bite   Discharge Condition: improved  Diet recommendation: regular  Filed Weights   12/22/15 2255 12/23/15 0456  Weight: 70.308 kg (155 lb) 71.759 kg (158 lb 3.2 oz)    History of present illness:  15 yof with a hx of HTN, HLD, and chronic back pain presented with complaints of sudden, intermittent headache and associated fever with temp of 102, chills, mild SOB, diarrhea, and abd pain. She reported removing a tick recently, but did not know how long it had been there. She was admitted for observation and IV abx.  Hospital Course:  Pt presented to the ED for nausea and vomiting with associated nausea and headache. She reported a recent tick bite. She was started on doxycycline and urine and blood cultures were ordered which showed no growth. CXR also showed no evidence of PNA. RMSF and Lyme titers were ordered. The following day, pt did not have any evidence of fevers and felt significantly improved. The exact etiology of her symptoms are not clear, since it may have been a viral illness versus tic bourne illness. She will be discharged with a course of doxycycline and titers will need to be followed up. She will also need to follow up with her PCP in 1-2 weeks. 1. Essential HTN. Continue home meds 2. HLD. Continue statins.  Procedures:  none  Consultations:  none  Discharge Exam: Filed Vitals:   12/23/15 2220 12/24/15 0604  BP: 152/62 134/40  Pulse: 64 63  Temp: 98.4 F (36.9 C) 98 F (36.7  C)  Resp: 20 20   Examination:  General exam: Appears calm and comfortable  Respiratory system: Clear to auscultation. Respiratory effort normal. Cardiovascular system: S1 & S2 heard, RRR. No JVD, murmurs, rubs, gallops or clicks. No pedal edema. Gastrointestinal system: Abdomen is nondistended, soft and nontender. No organomegaly or masses felt. Normal bowel sounds heard. Central nervous system: Alert and oriented. No focal neurological deficits. Extremities: Symmetric 5 x 5 power. Skin: No rashes, lesions or ulcers Psychiatry: Judgement and insight appear normal. Mood & affect appropriate.    Discharge Instructions   Discharge Instructions    Diet - low sodium heart healthy    Complete by:  As directed      Increase activity slowly    Complete by:  As directed           Current Discharge Medication List    START taking these medications   Details  doxycycline (VIBRAMYCIN) 100 MG capsule Take 1 capsule (100 mg total) by mouth 2 (two) times daily. Qty: 28 capsule, Refills: 0      CONTINUE these medications which have NOT CHANGED   Details  ALPRAZolam (XANAX) 0.25 MG tablet Take 1 tablet (0.25 mg total) by mouth 2 (two) times daily as needed for anxiety (Dental procedure). Qty: 15 tablet, Refills: 0    amLODipine (NORVASC) 5 MG tablet Take 1 tablet (5 mg total) by mouth daily. Qty: 90 tablet, Refills: 3    Ascorbic Acid 500 MG CHEW Chew 1 each  by mouth daily.     benazepril-hydrochlorthiazide (LOTENSIN HCT) 20-25 MG tablet TAKE ONE TABLET BY MOUTH ONCE DAILY Qty: 90 tablet, Refills: 0    Cholecalciferol (VITAMIN D3) 2000 UNITS CHEW Chew 1 each by mouth 2 (two) times a week.    Elastic Bandages & Supports ([employer] LUMBAR/BACK SUPPORT BRACE) MISC 1 Device by Does not apply route daily. Qty: 1 each, Refills: 2   Associated Diagnoses: Chronic back pain    lovastatin (MEVACOR) 40 MG tablet TAKE ONE TABLET BY MOUTH ONCE DAILY AT BEDTIME Qty: 90 tablet, Refills: 1     traMADol (ULTRAM) 50 MG tablet Take 1 tablet (50 mg total) by mouth every 8 (eight) hours as needed. Qty: 60 tablet, Refills: 3   Associated Diagnoses: Arthritis of right shoulder region      STOP taking these medications     penicillin v potassium (VEETID) 500 MG tablet        Allergies  Allergen Reactions  . Meloxicam Other (See Comments)    Heart did not feel right.     The results of significant diagnostics from this hospitalization (including imaging, microbiology, ancillary and laboratory) are listed below for reference.    Significant Diagnostic Studies: Dg Chest 2 View  12/23/2015  CLINICAL DATA:  Sudden onset of diffuse headache 2 days ago. Central chest pain. Fever. EXAM: CHEST  2 VIEW COMPARISON:  None. FINDINGS: There is moderate right hemidiaphragm elevation. The lungs are clear. The pulmonary vasculature is normal. There is no pleural effusion. IMPRESSION: No active cardiopulmonary disease. Electronically Signed   By: Andreas Newport M.D.   On: 12/23/2015 01:31    Microbiology: Recent Results (from the past 240 hour(s))  Urine culture     Status: None (Preliminary result)   Collection Time: 12/22/15 11:29 PM  Result Value Ref Range Status   Specimen Description URINE, CLEAN CATCH  Final   Special Requests NONE  Final   Culture   Final    CULTURE REINCUBATED FOR BETTER GROWTH Performed at [facility]    Report Status PENDING  Incomplete  Culture, blood (routine x 2)     Status: None (Preliminary result)   Collection Time: 12/22/15 11:59 PM  Result Value Ref Range Status   Specimen Description BLOOD RIGHT ANTECUBITAL  Final   Special Requests BOTTLES DRAWN AEROBIC AND ANAEROBIC 4CC EACH  Final   Culture NO GROWTH 1 DAY  Final   Report Status PENDING  Incomplete  Culture, blood (routine x 2)     Status: None (Preliminary result)   Collection Time: 12/23/15 12:20 AM  Result Value Ref Range Status   Specimen Description BLOOD LEFT ANTECUBITAL  Final    Special Requests BOTTLES DRAWN AEROBIC AND ANAEROBIC 5CC EACH  Final   Culture NO GROWTH 1 DAY  Final   Report Status PENDING  Incomplete     Labs: Basic Metabolic Panel:  Recent Labs Lab 12/22/15 2359 12/24/15 0545  NA 141 144  K 3.1* 3.6  CL 102 112*  CO2 26 25  GLUCOSE 127* 96  BUN 16 8  CREATININE 0.86 0.76  CALCIUM 9.2 8.4*   Liver Function Tests:  Recent Labs Lab 12/22/15 2359  AST 28  ALT 23  ALKPHOS 59  BILITOT 1.1  PROT 8.0  ALBUMIN 4.1    Recent Labs Lab 12/22/15 2359  LIPASE 31   No results for input(s): AMMONIA in the last 168 hours. CBC:  Recent Labs Lab 12/22/15 2359 12/24/15 0545  WBC 8.1  6.0  NEUTROABS 5.6  --   HGB 15.0 12.5  HCT 42.9 36.9  MCV 87.7 89.8  PLT 180 157   Cardiac Enzymes:  Recent Labs Lab 12/22/15 2359  TROPONINI <0.03   BNP: BNP (last 3 results) No results for input(s): BNP in the last 8760 hours.  ProBNP (last 3 results) No results for input(s): PROBNP in the last 8760 hours.  CBG: No results for input(s): GLUCAP in the last 168 hours.   Signed:  Kathie Dike MD.  [facility] 12/24/2015, 11:53 AM  By signing my name below, I, Delene Ruffini, attest that this documentation has been prepared under the direction and in the presence of Kathie Dike, MD. Electronically Signed: Delene Ruffini 12/24/2015 11:35am  I, Dr. Kathie Dike, personally performed the services described in this documentaiton. All medical record entries made by the scribe were at my direction and in my presence. I have reviewed the chart and agree that the record reflects my personal performance and is accurate and complete  Kathie Dike, MD, 12/24/2015 11:53 AM

## 2015-12-24 NOTE — Progress Notes (Signed)
Pt transported to lobby via wheelchair by NT

## 2015-12-24 NOTE — Progress Notes (Signed)
Pt IV removed, tolerated well.  Reviewed discharge information with pt and answered questions at this time.  Pt awaiting ride for discharge, will continue to monitor until pt leaves the floor.

## 2015-12-28 LAB — CULTURE, BLOOD (ROUTINE X 2)
Culture: NO GROWTH
Culture: NO GROWTH

## 2016-01-20 ENCOUNTER — Ambulatory Visit (INDEPENDENT_AMBULATORY_CARE_PROVIDER_SITE_OTHER): Payer: Medicare Other | Admitting: Family

## 2016-01-20 ENCOUNTER — Encounter: Payer: Self-pay | Admitting: Family

## 2016-01-20 VITALS — BP 116/70 | HR 47 | Temp 98.0°F | Ht 64.0 in | Wt 157.8 lb

## 2016-01-20 DIAGNOSIS — E663 Overweight: Secondary | ICD-10-CM | POA: Diagnosis not present

## 2016-01-20 DIAGNOSIS — M549 Dorsalgia, unspecified: Secondary | ICD-10-CM | POA: Diagnosis not present

## 2016-01-20 DIAGNOSIS — M19011 Primary osteoarthritis, right shoulder: Secondary | ICD-10-CM

## 2016-01-20 DIAGNOSIS — M545 Low back pain: Secondary | ICD-10-CM

## 2016-01-20 DIAGNOSIS — B349 Viral infection, unspecified: Secondary | ICD-10-CM | POA: Diagnosis not present

## 2016-01-20 DIAGNOSIS — M129 Arthropathy, unspecified: Secondary | ICD-10-CM | POA: Diagnosis not present

## 2016-01-20 DIAGNOSIS — G8929 Other chronic pain: Secondary | ICD-10-CM | POA: Diagnosis not present

## 2016-01-20 DIAGNOSIS — Z09 Encounter for follow-up examination after completed treatment for conditions other than malignant neoplasm: Secondary | ICD-10-CM | POA: Diagnosis not present

## 2016-01-20 MED ORDER — TRAMADOL HCL 50 MG PO TABS: 100.0000 mg | ORAL_TABLET | Freq: Three times a day (TID) | ORAL | Status: DC | PRN

## 2016-01-20 NOTE — Patient Instructions (Signed)

## 2016-01-20 NOTE — Progress Notes (Signed)
   Subjective:    Patient ID: Belinda Scott, female    DOB: [birth year], 75 y.o.   MRN: [id number]  Pt presents to the office today for hospital follow up. Pt went to the ED on 12/22/15 for N&V, chills, fever 102, headache, and removing a recent tick. Pt's lab work was negative. Pt states she thought she had a "very bad virus", but states she is feeling much better and her symptoms have resolved.  Pt was admitted over night for observation and was started on doxycyline.    PT is requesting a referral to Ortho for chronic low back pain. Pt has been to a chiropractor with mild relief. Pt has seen an Ortho in [place], but does not want to return to them.  Back Pain The current episode started more than 1 year ago. The problem occurs constantly. The problem is unchanged. The pain is present in the lumbar spine. The quality of the pain is described as aching. The pain is at a severity of 10/10. The pain is moderate. The symptoms are aggravated by bending and lying down. Associated symptoms include leg pain. Pertinent negatives include no bladder incontinence, bowel incontinence, dysuria or tingling. Risk factors include obesity. She has tried chiropractic manipulation and NSAIDs for the symptoms. The treatment provided mild relief.    [facility] notes reviewed  Review of Systems  Gastrointestinal: Negative for bowel incontinence.  Genitourinary: Negative for bladder incontinence and dysuria.  Musculoskeletal: Positive for back pain.  Neurological: Negative for tingling.  All other systems reviewed and are negative.      Objective:   Physical Exam  Constitutional: She is oriented to person, place, and time. She appears well-developed and well-nourished. No distress.  HENT:  Head: Normocephalic.  Eyes: Pupils are equal, round, and reactive to light.  Neck: Normal range of motion. Neck supple. No thyromegaly present.  Cardiovascular: Normal rate, regular rhythm, normal heart sounds and intact distal  pulses.   No murmur heard. Pulmonary/Chest: Effort normal and breath sounds normal. No respiratory distress. She has no wheezes.  Abdominal: Soft. Bowel sounds are normal. She exhibits no distension. There is no tenderness.  Musculoskeletal: Normal range of motion. She exhibits no edema or tenderness.  Neurological: She is alert and oriented to person, place, and time.  Skin: Skin is warm and dry.  Psychiatric: She has a normal mood and affect. Her behavior is normal. Judgment and thought content normal.  Vitals reviewed.     BP 116/70 mmHg  Pulse 47  Temp(Src) 98 F (36.7 C) (Oral)  Ht 5\' 4"  (1.626 m)  Wt 157 lb 12.8 oz (71.578 kg)  BMI 27.07 kg/m2     Assessment & Plan:  1. Tick bite  2. Viral infection -resolved  3. Chronic low back pain  4. Back pain, chronic -ROM exercises discussed -Ultram increased to 100 mg BID - AMB referral to orthopedics - traMADol (ULTRAM) 50 MG tablet; Take 2 tablets (100 mg total) by mouth every 8 (eight) hours as needed.  Dispense: 120 tablet; Refill: 3  5. Overweight (BMI 25.0-29.9)  6. Hospital discharge follow-up  7. Arthritis of right shoulder region - AMB referral to orthopedics - traMADol (ULTRAM) 50 MG tablet; Take 2 tablets (100 mg total) by mouth every 8 (eight) hours as needed.  Dispense: 120 tablet; Refill: [place], FNP

## 2016-01-28 ENCOUNTER — Other Ambulatory Visit: Payer: Self-pay | Admitting: Family

## 2016-02-17 ENCOUNTER — Encounter: Payer: Self-pay | Admitting: Family

## 2016-02-17 ENCOUNTER — Ambulatory Visit (INDEPENDENT_AMBULATORY_CARE_PROVIDER_SITE_OTHER): Payer: Medicare Other | Admitting: Family

## 2016-02-17 VITALS — BP 136/66 | HR 65 | Temp 97.7°F | Ht 64.0 in | Wt 158.0 lb

## 2016-02-17 DIAGNOSIS — M129 Arthropathy, unspecified: Secondary | ICD-10-CM | POA: Diagnosis not present

## 2016-02-17 DIAGNOSIS — M25511 Pain in right shoulder: Secondary | ICD-10-CM | POA: Diagnosis not present

## 2016-02-17 DIAGNOSIS — M19011 Primary osteoarthritis, right shoulder: Secondary | ICD-10-CM

## 2016-02-17 MED ORDER — METHYLPREDNISOLONE ACETATE 40 MG/ML IJ SUSP
40.0000 mg | Freq: Once | INTRAMUSCULAR | Status: AC
  Administered 2016-02-17: 40 mg via INTRA_ARTICULAR

## 2016-02-17 MED ORDER — BUPIVACAINE HCL 0.25 % IJ SOLN
1.0000 mL | Freq: Once | INTRAMUSCULAR | Status: AC
  Administered 2016-02-17: 1 mL via INTRA_ARTICULAR

## 2016-02-17 NOTE — Patient Instructions (Signed)

## 2016-02-17 NOTE — Progress Notes (Signed)
   Subjective:    Patient ID: Belinda Scott, female    DOB: 07/29/1941, 75 y.o.   MRN: [id number]  Pt presents to the office today for right shoulder injection for arthritis. Shoulder Pain  The pain is present in the right shoulder. This is a chronic problem. The current episode started more than 1 year ago. There has been no history of extremity trauma. The problem occurs intermittently. The problem has been unchanged. The quality of the pain is described as aching. The pain is at a severity of 8/10. The pain is moderate. Associated symptoms include a limited range of motion. Pertinent negatives include no inability to bear weight, itching, numbness or tingling. The symptoms are aggravated by activity. She has tried rest and oral narcotics for the symptoms. The treatment provided moderate relief. Her past medical history is significant for osteoarthritis.      Review of Systems  Constitutional: Negative.   HENT: Negative.   Eyes: Negative.   Respiratory: Negative.  Negative for shortness of breath.   Cardiovascular: Negative.  Negative for palpitations.  Gastrointestinal: Negative.   Endocrine: Negative.   Genitourinary: Negative.   Musculoskeletal: Negative.   Skin: Negative for itching.  Neurological: Negative.  Negative for tingling, numbness and headaches.  Hematological: Negative.   Psychiatric/Behavioral: Negative.   All other systems reviewed and are negative.      Objective:   Physical Exam  Constitutional: She is oriented to person, place, and time. She appears well-developed and well-nourished. No distress.  HENT:  Head: Normocephalic.  Eyes: Pupils are equal, round, and reactive to light.  Cardiovascular: Normal rate, regular rhythm, normal heart sounds and intact distal pulses.   No murmur heard. Pulmonary/Chest: Effort normal and breath sounds normal. No respiratory distress. She has no wheezes.  Abdominal: Soft. Bowel sounds are normal. She exhibits no distension.  There is no tenderness.  Musculoskeletal: She exhibits no edema or tenderness.  Decreased ROM of right arm with extending related to pain  Neurological: She is alert and oriented to person, place, and time.  Skin: Skin is warm and dry.  Psychiatric: She has a normal mood and affect. Her behavior is normal. Judgment and thought content normal.  Vitals reviewed.   rightshoulder prepped with betadine Injected with Marcaine .25%  and methylprednisolone e with 22 guage needle. Patient tolerated well.  BP 136/66 mmHg  Pulse 65  Temp(Src) 97.7 F (36.5 C) (Oral)  Ht 5\' 4"  (1.626 m)  Wt 158 lb (71.668 kg)  BMI 27.11 kg/m2       Assessment & Plan:  1. Arthritis of right shoulder region -Pt given ROM exercises discussed with patient -Continue pain medicatins -RTO prn  - methylPREDNISolone acetate (DEPO-MEDROL) injection 40 mg; Inject 1 mL (40 mg total) into the articular space once. - bupivacaine (MARCAINE) 0.25 % (with pres) injection 1 mL; Inject 1 mL into the articular space once.   Evelina Dun, FNP

## 2016-04-18 ENCOUNTER — Other Ambulatory Visit: Payer: Self-pay | Admitting: Family

## 2016-05-10 DIAGNOSIS — M9902 Segmental and somatic dysfunction of thoracic region: Secondary | ICD-10-CM | POA: Diagnosis not present

## 2016-05-10 DIAGNOSIS — M9903 Segmental and somatic dysfunction of lumbar region: Secondary | ICD-10-CM | POA: Diagnosis not present

## 2016-05-10 DIAGNOSIS — M9901 Segmental and somatic dysfunction of cervical region: Secondary | ICD-10-CM | POA: Diagnosis not present

## 2016-05-10 DIAGNOSIS — M5137 Other intervertebral disc degeneration, lumbosacral region: Secondary | ICD-10-CM | POA: Diagnosis not present

## 2016-05-13 DIAGNOSIS — M9901 Segmental and somatic dysfunction of cervical region: Secondary | ICD-10-CM | POA: Diagnosis not present

## 2016-05-13 DIAGNOSIS — M9902 Segmental and somatic dysfunction of thoracic region: Secondary | ICD-10-CM | POA: Diagnosis not present

## 2016-05-13 DIAGNOSIS — M5137 Other intervertebral disc degeneration, lumbosacral region: Secondary | ICD-10-CM | POA: Diagnosis not present

## 2016-05-13 DIAGNOSIS — M9903 Segmental and somatic dysfunction of lumbar region: Secondary | ICD-10-CM | POA: Diagnosis not present

## 2016-05-17 DIAGNOSIS — M5137 Other intervertebral disc degeneration, lumbosacral region: Secondary | ICD-10-CM | POA: Diagnosis not present

## 2016-05-17 DIAGNOSIS — M9903 Segmental and somatic dysfunction of lumbar region: Secondary | ICD-10-CM | POA: Diagnosis not present

## 2016-05-17 DIAGNOSIS — M9901 Segmental and somatic dysfunction of cervical region: Secondary | ICD-10-CM | POA: Diagnosis not present

## 2016-05-17 DIAGNOSIS — M9902 Segmental and somatic dysfunction of thoracic region: Secondary | ICD-10-CM | POA: Diagnosis not present

## 2016-05-20 DIAGNOSIS — M5137 Other intervertebral disc degeneration, lumbosacral region: Secondary | ICD-10-CM | POA: Diagnosis not present

## 2016-05-20 DIAGNOSIS — M9902 Segmental and somatic dysfunction of thoracic region: Secondary | ICD-10-CM | POA: Diagnosis not present

## 2016-05-20 DIAGNOSIS — M9901 Segmental and somatic dysfunction of cervical region: Secondary | ICD-10-CM | POA: Diagnosis not present

## 2016-05-20 DIAGNOSIS — M9903 Segmental and somatic dysfunction of lumbar region: Secondary | ICD-10-CM | POA: Diagnosis not present

## 2016-05-24 DIAGNOSIS — M9901 Segmental and somatic dysfunction of cervical region: Secondary | ICD-10-CM | POA: Diagnosis not present

## 2016-05-24 DIAGNOSIS — M9903 Segmental and somatic dysfunction of lumbar region: Secondary | ICD-10-CM | POA: Diagnosis not present

## 2016-05-24 DIAGNOSIS — M9902 Segmental and somatic dysfunction of thoracic region: Secondary | ICD-10-CM | POA: Diagnosis not present

## 2016-05-24 DIAGNOSIS — M5137 Other intervertebral disc degeneration, lumbosacral region: Secondary | ICD-10-CM | POA: Diagnosis not present

## 2016-05-27 DIAGNOSIS — M9902 Segmental and somatic dysfunction of thoracic region: Secondary | ICD-10-CM | POA: Diagnosis not present

## 2016-05-27 DIAGNOSIS — M9901 Segmental and somatic dysfunction of cervical region: Secondary | ICD-10-CM | POA: Diagnosis not present

## 2016-05-27 DIAGNOSIS — M9903 Segmental and somatic dysfunction of lumbar region: Secondary | ICD-10-CM | POA: Diagnosis not present

## 2016-05-27 DIAGNOSIS — M5137 Other intervertebral disc degeneration, lumbosacral region: Secondary | ICD-10-CM | POA: Diagnosis not present

## 2016-05-31 ENCOUNTER — Ambulatory Visit (INDEPENDENT_AMBULATORY_CARE_PROVIDER_SITE_OTHER): Payer: Medicare Other

## 2016-05-31 DIAGNOSIS — Z23 Encounter for immunization: Secondary | ICD-10-CM

## 2016-05-31 DIAGNOSIS — M5137 Other intervertebral disc degeneration, lumbosacral region: Secondary | ICD-10-CM | POA: Diagnosis not present

## 2016-05-31 DIAGNOSIS — M9901 Segmental and somatic dysfunction of cervical region: Secondary | ICD-10-CM | POA: Diagnosis not present

## 2016-05-31 DIAGNOSIS — M9903 Segmental and somatic dysfunction of lumbar region: Secondary | ICD-10-CM | POA: Diagnosis not present

## 2016-05-31 DIAGNOSIS — M9902 Segmental and somatic dysfunction of thoracic region: Secondary | ICD-10-CM | POA: Diagnosis not present

## 2016-06-03 DIAGNOSIS — M9902 Segmental and somatic dysfunction of thoracic region: Secondary | ICD-10-CM | POA: Diagnosis not present

## 2016-06-03 DIAGNOSIS — M9901 Segmental and somatic dysfunction of cervical region: Secondary | ICD-10-CM | POA: Diagnosis not present

## 2016-06-03 DIAGNOSIS — M9903 Segmental and somatic dysfunction of lumbar region: Secondary | ICD-10-CM | POA: Diagnosis not present

## 2016-06-03 DIAGNOSIS — M5137 Other intervertebral disc degeneration, lumbosacral region: Secondary | ICD-10-CM | POA: Diagnosis not present

## 2016-06-09 DIAGNOSIS — M5137 Other intervertebral disc degeneration, lumbosacral region: Secondary | ICD-10-CM | POA: Diagnosis not present

## 2016-06-09 DIAGNOSIS — M9902 Segmental and somatic dysfunction of thoracic region: Secondary | ICD-10-CM | POA: Diagnosis not present

## 2016-06-09 DIAGNOSIS — M9903 Segmental and somatic dysfunction of lumbar region: Secondary | ICD-10-CM | POA: Diagnosis not present

## 2016-06-09 DIAGNOSIS — M9901 Segmental and somatic dysfunction of cervical region: Secondary | ICD-10-CM | POA: Diagnosis not present

## 2016-06-16 DIAGNOSIS — M9903 Segmental and somatic dysfunction of lumbar region: Secondary | ICD-10-CM | POA: Diagnosis not present

## 2016-06-16 DIAGNOSIS — M9902 Segmental and somatic dysfunction of thoracic region: Secondary | ICD-10-CM | POA: Diagnosis not present

## 2016-06-16 DIAGNOSIS — M9901 Segmental and somatic dysfunction of cervical region: Secondary | ICD-10-CM | POA: Diagnosis not present

## 2016-06-16 DIAGNOSIS — M5137 Other intervertebral disc degeneration, lumbosacral region: Secondary | ICD-10-CM | POA: Diagnosis not present

## 2016-06-21 ENCOUNTER — Other Ambulatory Visit: Payer: Self-pay | Admitting: Family

## 2016-06-23 DIAGNOSIS — M9901 Segmental and somatic dysfunction of cervical region: Secondary | ICD-10-CM | POA: Diagnosis not present

## 2016-06-23 DIAGNOSIS — M5137 Other intervertebral disc degeneration, lumbosacral region: Secondary | ICD-10-CM | POA: Diagnosis not present

## 2016-06-23 DIAGNOSIS — M9902 Segmental and somatic dysfunction of thoracic region: Secondary | ICD-10-CM | POA: Diagnosis not present

## 2016-06-23 DIAGNOSIS — M9903 Segmental and somatic dysfunction of lumbar region: Secondary | ICD-10-CM | POA: Diagnosis not present

## 2016-08-18 ENCOUNTER — Other Ambulatory Visit: Payer: Self-pay | Admitting: Family

## 2016-08-31 DIAGNOSIS — M9903 Segmental and somatic dysfunction of lumbar region: Secondary | ICD-10-CM | POA: Diagnosis not present

## 2016-08-31 DIAGNOSIS — M9902 Segmental and somatic dysfunction of thoracic region: Secondary | ICD-10-CM | POA: Diagnosis not present

## 2016-08-31 DIAGNOSIS — M5137 Other intervertebral disc degeneration, lumbosacral region: Secondary | ICD-10-CM | POA: Diagnosis not present

## 2016-08-31 DIAGNOSIS — M9901 Segmental and somatic dysfunction of cervical region: Secondary | ICD-10-CM | POA: Diagnosis not present

## 2016-09-06 ENCOUNTER — Telehealth: Payer: Self-pay | Admitting: Family

## 2016-09-06 NOTE — Telephone Encounter (Signed)
Pt needs appt - she needs tramadol

## 2016-09-07 ENCOUNTER — Ambulatory Visit (INDEPENDENT_AMBULATORY_CARE_PROVIDER_SITE_OTHER): Payer: Medicare Other | Admitting: Family

## 2016-09-07 ENCOUNTER — Encounter: Payer: Self-pay | Admitting: Family

## 2016-09-07 VITALS — BP 139/86 | HR 64 | Temp 97.6°F | Ht 64.0 in | Wt 154.8 lb

## 2016-09-07 DIAGNOSIS — M19011 Primary osteoarthritis, right shoulder: Secondary | ICD-10-CM

## 2016-09-07 DIAGNOSIS — E663 Overweight: Secondary | ICD-10-CM

## 2016-09-07 DIAGNOSIS — I10 Essential (primary) hypertension: Secondary | ICD-10-CM

## 2016-09-07 DIAGNOSIS — E785 Hyperlipidemia, unspecified: Secondary | ICD-10-CM | POA: Diagnosis not present

## 2016-09-07 DIAGNOSIS — M545 Low back pain: Secondary | ICD-10-CM

## 2016-09-07 DIAGNOSIS — G8929 Other chronic pain: Secondary | ICD-10-CM | POA: Diagnosis not present

## 2016-09-07 DIAGNOSIS — E559 Vitamin D deficiency, unspecified: Secondary | ICD-10-CM

## 2016-09-07 DIAGNOSIS — F411 Generalized anxiety disorder: Secondary | ICD-10-CM

## 2016-09-07 MED ORDER — TRAMADOL HCL 50 MG PO TABS: 100.0000 mg | ORAL_TABLET | Freq: Three times a day (TID) | ORAL | 3 refills | Status: DC | PRN

## 2016-09-07 NOTE — Progress Notes (Signed)
Subjective:    Patient ID: Belinda Scott, female    DOB: 12/16/1940, 76 y.o.   MRN: [id number]  Pt presents to the office for  Back Pain  This is a chronic problem. The current episode started more than 1 year ago. The problem occurs intermittently. The pain is present in the lumbar spine. The quality of the pain is described as aching. The pain is at a severity of 9/10. The symptoms are aggravated by lying down. Associated symptoms include leg pain ("at times"). Pertinent negatives include no bladder incontinence, bowel incontinence, headaches, numbness or tingling. She has tried bed rest and NSAIDs for the symptoms.  Shoulder Pain   The pain is present in the right shoulder. This is a chronic problem. The current episode started more than 1 year ago. There has been no history of extremity trauma. The problem occurs intermittently. The problem has been waxing and waning. The quality of the pain is described as aching. The pain is at a severity of 8/10. The pain is mild. Associated symptoms include a limited range of motion and stiffness. Pertinent negatives include no inability to bear weight, joint swelling, numbness or tingling. The symptoms are aggravated by activity. She has tried oral narcotics and rest for the symptoms. The treatment provided mild relief. Family history does not include gout or rheumatoid arthritis. There is no history of diabetes.  Hyperlipidemia  This is a chronic problem. The current episode started more than 1 year ago. The problem is controlled. Recent lipid tests were reviewed and are normal. She has no history of diabetes. Associated symptoms include leg pain ("at times"). Pertinent negatives include no shortness of breath. Current antihyperlipidemic treatment includes statins. The current treatment provides moderate improvement of lipids.  Anxiety  Presents for follow-up visit. Symptoms include excessive worry and nervous/anxious behavior. Patient reports no decreased  concentration, depressed mood, irritability, nausea, palpitations, restlessness or shortness of breath. Symptoms occur rarely. The quality of sleep is good.    Hypertension  This is a chronic problem. The current episode started more than 1 year ago. The problem has been resolved since onset. The problem is controlled. Associated symptoms include anxiety. Pertinent negatives include no headaches, palpitations, peripheral edema or shortness of breath. Risk factors for coronary artery disease include obesity, sedentary lifestyle and dyslipidemia. Past treatments include calcium channel blockers, ACE inhibitors and diuretics. The current treatment provides moderate improvement. There is no history of kidney disease, CAD/MI, CVA, heart failure or a thyroid problem.      Review of Systems  Constitutional: Negative.  Negative for irritability.  HENT: Negative.   Eyes: Negative.   Respiratory: Negative.  Negative for shortness of breath.   Cardiovascular: Negative.  Negative for palpitations.  Gastrointestinal: Negative.  Negative for bowel incontinence and nausea.  Endocrine: Negative.   Genitourinary: Negative.  Negative for bladder incontinence.  Musculoskeletal: Positive for back pain and stiffness.  Neurological: Negative.  Negative for tingling, numbness and headaches.  Hematological: Negative.   Psychiatric/Behavioral: Negative for decreased concentration. The patient is nervous/anxious.   All other systems reviewed and are negative.      Objective:   Physical Exam  Constitutional: She is oriented to person, place, and time. She appears well-developed and well-nourished. No distress.  HENT:  Head: Normocephalic and atraumatic.  Right Ear: External ear normal.  Left Ear: External ear normal.  Nose: Nose normal.  Mouth/Throat: Oropharynx is clear and moist.  Eyes: Pupils are equal, round, and reactive to light.  Neck:  Normal range of motion. Neck supple. No thyromegaly present.    Cardiovascular: Normal rate, regular rhythm, normal heart sounds and intact distal pulses.   No murmur heard. Pulmonary/Chest: Effort normal and breath sounds normal. No respiratory distress. She has no wheezes.  Abdominal: Soft. Bowel sounds are normal. She exhibits no distension. There is no tenderness.  Musculoskeletal: She exhibits no edema or tenderness.  Neurological: She is alert and oriented to person, place, and time.  Skin: Skin is warm and dry.  Psychiatric: She has a normal mood and affect. Her behavior is normal. Judgment and thought content normal.  Vitals reviewed.    BP 139/86   Pulse 64   Temp 97.6 F (36.4 C) (Oral)   Ht 5' 4" (1.626 m)   Wt 154 lb 12.8 oz (70.2 kg)   BMI 26.57 kg/m       Assessment & Plan:  1. Essential hypertension - CMP14+EGFR  2. Arthritis of right shoulder region - CMP14+EGFR - traMADol (ULTRAM) 50 MG tablet; Take 2 tablets (100 mg total) by mouth every 8 (eight) hours as needed.  Dispense: 60 tablet; Refill: 3  3. Vitamin D deficiency - CMP14+EGFR - VITAMIN D 25 Hydroxy (Vit-D Deficiency, Fractures)  4. Overweight (BMI 25.0-29.9) - CMP14+EGFR  5. Hyperlipidemia, unspecified hyperlipidemia type - CMP14+EGFR - Lipid panel  6. Chronic bilateral low back pain, with sciatica presence unspecified - CMP14+EGFR - traMADol (ULTRAM) 50 MG tablet; Take 2 tablets (100 mg total) by mouth every 8 (eight) hours as needed.  Dispense: 60 tablet; Refill: 3  7. GAD (generalized anxiety disorder) - CMP14+EGFR   Continue all meds Labs pending Health Maintenance reviewed Diet and exercise encouraged RTO 6 months  Evelina Dun, FNP

## 2016-09-07 NOTE — Patient Instructions (Signed)

## 2016-09-08 LAB — CMP14+EGFR
A/G RATIO: 1.3 (ref 1.2–2.2)
ALK PHOS: 77 IU/L (ref 39–117)
ALT: 15 IU/L (ref 0–32)
AST: 22 IU/L (ref 0–40)
Albumin: 4.3 g/dL (ref 3.5–4.8)
BILIRUBIN TOTAL: 0.6 mg/dL (ref 0.0–1.2)
BUN/Creatinine Ratio: 21 (ref 12–28)
BUN: 17 mg/dL (ref 8–27)
CHLORIDE: 100 mmol/L (ref 96–106)
CO2: 25 mmol/L (ref 18–29)
Calcium: 9.5 mg/dL (ref 8.7–10.3)
Creatinine, Ser: 0.81 mg/dL (ref 0.57–1.00)
GFR calc Af Amer: 81 mL/min/{1.73_m2} (ref >59)
GFR calc non Af Amer: 70 mL/min/{1.73_m2} (ref >59)
GLUCOSE: 103 mg/dL — AB (ref 65–99)
Globulin, Total: 3.2 g/dL (ref 1.5–4.5)
POTASSIUM: 4 mmol/L (ref 3.5–5.2)
Sodium: 144 mmol/L (ref 134–144)
Total Protein: 7.5 g/dL (ref 6.0–8.5)

## 2016-09-08 LAB — LIPID PANEL
CHOLESTEROL TOTAL: 148 mg/dL (ref 100–199)
Chol/HDL Ratio: 2.6 ratio units (ref 0.0–4.4)
HDL: 56 mg/dL (ref >39)
LDL Calculated: 66 mg/dL (ref 0–99)
Triglycerides: 130 mg/dL (ref 0–149)
VLDL CHOLESTEROL CAL: 26 mg/dL (ref 5–40)

## 2016-09-08 LAB — VITAMIN D 25 HYDROXY (VIT D DEFICIENCY, FRACTURES): VIT D 25 HYDROXY: 34.4 ng/mL (ref 30.0–100.0)

## 2016-09-16 DIAGNOSIS — L570 Actinic keratosis: Secondary | ICD-10-CM | POA: Diagnosis not present

## 2016-09-16 DIAGNOSIS — X32XXXD Exposure to sunlight, subsequent encounter: Secondary | ICD-10-CM | POA: Diagnosis not present

## 2016-09-16 DIAGNOSIS — B078 Other viral warts: Secondary | ICD-10-CM | POA: Diagnosis not present

## 2016-09-16 DIAGNOSIS — D225 Melanocytic nevi of trunk: Secondary | ICD-10-CM | POA: Diagnosis not present

## 2016-09-22 ENCOUNTER — Telehealth: Payer: Self-pay | Admitting: Family

## 2016-09-22 NOTE — Telephone Encounter (Signed)
Pt informed of lab results, these had been release to MyChart

## 2016-10-07 DIAGNOSIS — M9902 Segmental and somatic dysfunction of thoracic region: Secondary | ICD-10-CM | POA: Diagnosis not present

## 2016-10-07 DIAGNOSIS — M5137 Other intervertebral disc degeneration, lumbosacral region: Secondary | ICD-10-CM | POA: Diagnosis not present

## 2016-10-07 DIAGNOSIS — M9903 Segmental and somatic dysfunction of lumbar region: Secondary | ICD-10-CM | POA: Diagnosis not present

## 2016-10-07 DIAGNOSIS — M9901 Segmental and somatic dysfunction of cervical region: Secondary | ICD-10-CM | POA: Diagnosis not present

## 2016-11-09 DIAGNOSIS — M9902 Segmental and somatic dysfunction of thoracic region: Secondary | ICD-10-CM | POA: Diagnosis not present

## 2016-11-09 DIAGNOSIS — M9903 Segmental and somatic dysfunction of lumbar region: Secondary | ICD-10-CM | POA: Diagnosis not present

## 2016-11-09 DIAGNOSIS — M5137 Other intervertebral disc degeneration, lumbosacral region: Secondary | ICD-10-CM | POA: Diagnosis not present

## 2016-11-09 DIAGNOSIS — M9901 Segmental and somatic dysfunction of cervical region: Secondary | ICD-10-CM | POA: Diagnosis not present

## 2016-12-02 DIAGNOSIS — H2513 Age-related nuclear cataract, bilateral: Secondary | ICD-10-CM | POA: Diagnosis not present

## 2016-12-02 DIAGNOSIS — H40033 Anatomical narrow angle, bilateral: Secondary | ICD-10-CM | POA: Diagnosis not present

## 2016-12-08 ENCOUNTER — Other Ambulatory Visit: Payer: Self-pay | Admitting: Family

## 2016-12-09 DIAGNOSIS — M9903 Segmental and somatic dysfunction of lumbar region: Secondary | ICD-10-CM | POA: Diagnosis not present

## 2016-12-09 DIAGNOSIS — M9901 Segmental and somatic dysfunction of cervical region: Secondary | ICD-10-CM | POA: Diagnosis not present

## 2016-12-09 DIAGNOSIS — M9902 Segmental and somatic dysfunction of thoracic region: Secondary | ICD-10-CM | POA: Diagnosis not present

## 2016-12-09 DIAGNOSIS — M5137 Other intervertebral disc degeneration, lumbosacral region: Secondary | ICD-10-CM | POA: Diagnosis not present

## 2017-01-06 DIAGNOSIS — M9905 Segmental and somatic dysfunction of pelvic region: Secondary | ICD-10-CM | POA: Diagnosis not present

## 2017-01-06 DIAGNOSIS — M9903 Segmental and somatic dysfunction of lumbar region: Secondary | ICD-10-CM | POA: Diagnosis not present

## 2017-01-06 DIAGNOSIS — M5137 Other intervertebral disc degeneration, lumbosacral region: Secondary | ICD-10-CM | POA: Diagnosis not present

## 2017-01-06 DIAGNOSIS — M9904 Segmental and somatic dysfunction of sacral region: Secondary | ICD-10-CM | POA: Diagnosis not present

## 2017-02-07 DIAGNOSIS — M9904 Segmental and somatic dysfunction of sacral region: Secondary | ICD-10-CM | POA: Diagnosis not present

## 2017-02-07 DIAGNOSIS — M5137 Other intervertebral disc degeneration, lumbosacral region: Secondary | ICD-10-CM | POA: Diagnosis not present

## 2017-02-07 DIAGNOSIS — M9903 Segmental and somatic dysfunction of lumbar region: Secondary | ICD-10-CM | POA: Diagnosis not present

## 2017-02-07 DIAGNOSIS — M9905 Segmental and somatic dysfunction of pelvic region: Secondary | ICD-10-CM | POA: Diagnosis not present

## 2017-03-02 ENCOUNTER — Other Ambulatory Visit: Payer: Self-pay | Admitting: Family

## 2017-03-09 DIAGNOSIS — M9904 Segmental and somatic dysfunction of sacral region: Secondary | ICD-10-CM | POA: Diagnosis not present

## 2017-03-09 DIAGNOSIS — M5137 Other intervertebral disc degeneration, lumbosacral region: Secondary | ICD-10-CM | POA: Diagnosis not present

## 2017-03-09 DIAGNOSIS — M9903 Segmental and somatic dysfunction of lumbar region: Secondary | ICD-10-CM | POA: Diagnosis not present

## 2017-03-09 DIAGNOSIS — M9905 Segmental and somatic dysfunction of pelvic region: Secondary | ICD-10-CM | POA: Diagnosis not present

## 2017-03-26 ENCOUNTER — Other Ambulatory Visit: Payer: Self-pay | Admitting: Family

## 2017-03-28 NOTE — Telephone Encounter (Signed)
Last seen 09/07/16  Belinda Scott 

## 2017-04-13 DIAGNOSIS — M9904 Segmental and somatic dysfunction of sacral region: Secondary | ICD-10-CM | POA: Diagnosis not present

## 2017-04-13 DIAGNOSIS — M9903 Segmental and somatic dysfunction of lumbar region: Secondary | ICD-10-CM | POA: Diagnosis not present

## 2017-04-13 DIAGNOSIS — M9905 Segmental and somatic dysfunction of pelvic region: Secondary | ICD-10-CM | POA: Diagnosis not present

## 2017-04-13 DIAGNOSIS — M5137 Other intervertebral disc degeneration, lumbosacral region: Secondary | ICD-10-CM | POA: Diagnosis not present

## 2017-06-01 ENCOUNTER — Other Ambulatory Visit: Payer: Self-pay

## 2017-06-01 MED ORDER — BENAZEPRIL-HYDROCHLOROTHIAZIDE 20-25 MG PO TABS
1.0000 | ORAL_TABLET | Freq: Every day | ORAL | 0 refills | Status: DC
Start: 2017-06-01 — End: 2017-11-30

## 2017-06-01 MED ORDER — VITAMIN D3 50 MCG (2000 UT) PO CHEW: 1.0000 | CHEWABLE_TABLET | ORAL | 1 refills | Status: AC

## 2017-06-01 NOTE — Telephone Encounter (Signed)
Last seen 09/07/16  [facility]

## 2017-06-08 ENCOUNTER — Ambulatory Visit (INDEPENDENT_AMBULATORY_CARE_PROVIDER_SITE_OTHER): Payer: Medicare Other

## 2017-06-08 DIAGNOSIS — Z23 Encounter for immunization: Secondary | ICD-10-CM | POA: Diagnosis not present

## 2017-06-15 DIAGNOSIS — M5137 Other intervertebral disc degeneration, lumbosacral region: Secondary | ICD-10-CM | POA: Diagnosis not present

## 2017-06-15 DIAGNOSIS — M9905 Segmental and somatic dysfunction of pelvic region: Secondary | ICD-10-CM | POA: Diagnosis not present

## 2017-06-15 DIAGNOSIS — M9903 Segmental and somatic dysfunction of lumbar region: Secondary | ICD-10-CM | POA: Diagnosis not present

## 2017-06-15 DIAGNOSIS — M9904 Segmental and somatic dysfunction of sacral region: Secondary | ICD-10-CM | POA: Diagnosis not present

## 2017-06-19 ENCOUNTER — Other Ambulatory Visit: Payer: Self-pay | Admitting: Family

## 2017-06-20 NOTE — Telephone Encounter (Signed)
Last seen 09/07/16  [facility]

## 2017-08-06 ENCOUNTER — Other Ambulatory Visit: Payer: Self-pay | Admitting: Family

## 2017-08-08 NOTE — Telephone Encounter (Signed)
Last seen 09/07/16  [facility]

## 2017-10-06 ENCOUNTER — Other Ambulatory Visit: Payer: Self-pay | Admitting: Family

## 2017-10-10 ENCOUNTER — Other Ambulatory Visit: Payer: Self-pay | Admitting: Family

## 2017-10-10 NOTE — Telephone Encounter (Signed)
Last lipid 09/07/16  Belinda Scott

## 2017-11-30 ENCOUNTER — Ambulatory Visit (INDEPENDENT_AMBULATORY_CARE_PROVIDER_SITE_OTHER): Payer: Medicare Other | Admitting: Family

## 2017-11-30 ENCOUNTER — Encounter: Payer: Self-pay | Admitting: Family

## 2017-11-30 VITALS — BP 141/75 | HR 62 | Temp 97.4°F | Ht 64.0 in | Wt 169.2 lb

## 2017-11-30 DIAGNOSIS — K219 Gastro-esophageal reflux disease without esophagitis: Secondary | ICD-10-CM | POA: Diagnosis not present

## 2017-11-30 DIAGNOSIS — M19011 Primary osteoarthritis, right shoulder: Secondary | ICD-10-CM | POA: Diagnosis not present

## 2017-11-30 DIAGNOSIS — E663 Overweight: Secondary | ICD-10-CM

## 2017-11-30 DIAGNOSIS — I10 Essential (primary) hypertension: Secondary | ICD-10-CM

## 2017-11-30 DIAGNOSIS — E785 Hyperlipidemia, unspecified: Secondary | ICD-10-CM | POA: Diagnosis not present

## 2017-11-30 DIAGNOSIS — E559 Vitamin D deficiency, unspecified: Secondary | ICD-10-CM | POA: Diagnosis not present

## 2017-11-30 DIAGNOSIS — F411 Generalized anxiety disorder: Secondary | ICD-10-CM

## 2017-11-30 MED ORDER — ONDANSETRON 4 MG PO TBDP: 4.0000 mg | ORAL_TABLET | Freq: Three times a day (TID) | ORAL | 0 refills | Status: DC | PRN

## 2017-11-30 MED ORDER — OMEPRAZOLE 20 MG PO CPDR: 20.0000 mg | DELAYED_RELEASE_CAPSULE | Freq: Every day | ORAL | 3 refills | Status: DC

## 2017-11-30 NOTE — Progress Notes (Addendum)
Subjective:    Patient ID: Belinda Scott, female    DOB: 05/29/1941, 77 y.o.   MRN: [id number]  Pt presents to the office today for chronic follow up.  Hypertension  This is a chronic problem. The current episode started more than 1 year ago. The problem has been waxing and waning since onset. The problem is uncontrolled. Associated symptoms include anxiety and peripheral edema ("some times in my left side" ). Pertinent negatives include no shortness of breath. The current treatment provides mild improvement. There is no history of kidney disease, CAD/MI or heart failure.  Hyperlipidemia  This is a chronic problem. The current episode started more than 1 year ago. The problem is controlled. Recent lipid tests were reviewed and are normal. Pertinent negatives include no shortness of breath. Current antihyperlipidemic treatment includes statins. The current treatment provides moderate improvement of lipids. Risk factors for coronary artery disease include dyslipidemia, hypertension and post-menopausal.  Anxiety  Presents for follow-up visit. Symptoms include excessive worry, irritability and nervous/anxious behavior. Patient reports no shortness of breath. Symptoms occur occasionally. The severity of symptoms is mild. The quality of sleep is good.    Arthritis  Presents for follow-up visit. She complains of pain and stiffness. The symptoms have been stable. Affected locations include the left shoulder and right shoulder (back). Her pain is at a severity of 5/10.  Gastroesophageal Reflux  She complains of belching and heartburn. This is a chronic problem. The current episode started more than 1 year ago. The problem occurs occasionally. The problem has been waxing and waning. She has tried an antacid for the symptoms. The treatment provided mild relief.      Review of Systems  Constitutional: Positive for irritability.  Respiratory: Negative for shortness of breath.   Gastrointestinal:  Positive for heartburn.  Musculoskeletal: Positive for arthralgias, arthritis and stiffness.  Psychiatric/Behavioral: The patient is nervous/anxious.   All other systems reviewed and are negative.      Objective:   Physical Exam  Constitutional: She is oriented to person, place, and time. She appears well-developed and well-nourished. No distress.  HENT:  Head: Normocephalic and atraumatic.  Right Ear: External ear normal.  Left Ear: External ear normal.  Nose: Nose normal.  Mouth/Throat: Oropharynx is clear and moist.  Eyes: Pupils are equal, round, and reactive to light.  Neck: Normal range of motion. Neck supple. No thyromegaly present.  Cardiovascular: Normal rate, regular rhythm, normal heart sounds and intact distal pulses.  No murmur heard. Pulmonary/Chest: Effort normal and breath sounds normal. No respiratory distress. She has no wheezes.  Abdominal: Soft. Bowel sounds are normal. She exhibits no distension. There is no tenderness.  Musculoskeletal: Normal range of motion. She exhibits no edema or tenderness.  Neurological: She is alert and oriented to person, place, and time.  Skin: Skin is warm and dry.  Psychiatric: She has a normal mood and affect. Her behavior is normal. Judgment and thought content normal.  Vitals reviewed.    BP (!) 157/79   Pulse 60   Temp (!) 97.4 F (36.3 C) (Oral)   Ht '5\' 4"'$  (1.626 m)   Wt 169 lb 3.2 oz (76.7 kg)   BMI 29.04 kg/m      Assessment & Plan:  1. Essential hypertension - CMP14+EGFR  2. Arthritis of right shoulder region - CMP14+EGFR  3. Overweight (BMI 25.0-29.9) - CMP14+EGFR  4. Hyperlipidemia, unspecified hyperlipidemia type - CMP14+EGFR - Lipid panel  5. GAD (generalized anxiety disorder) - CMP14+EGFR  6.  Vitamin D deficiency - VITAMIN D 25 Hydroxy (Vit-D Deficiency, Fractures)  7. Gastroesophageal reflux disease, esophagitis presence not specified PT started on Prilosec and zofran -Diet discussed- Avoid  fried, spicy, citrus foods, caffeine and alcohol -Do not eat 2-3 hours before bedtime -Encouraged small frequent meals -Avoid NSAID's - omeprazole (PRILOSEC) 20 MG capsule; Take 1 capsule (20 mg total) by mouth daily.  Dispense: 30 capsule; Refill: 3 - ondansetron (ZOFRAN ODT) 4 MG disintegrating tablet; Take 1 tablet (4 mg total) by mouth every 8 (eight) hours as needed for nausea or vomiting.  Dispense: 20 tablet; Refill: 0   Continue all meds Labs pending Health Maintenance reviewed Diet and exercise encouraged RTO 6 months   Evelina Dun, FNP

## 2017-11-30 NOTE — Patient Instructions (Signed)

## 2017-11-30 NOTE — Addendum Note (Signed)
Addended by: Evelina Dun A on: 11/30/2017 09:30 AM   Modules accepted: Orders

## 2017-12-01 ENCOUNTER — Other Ambulatory Visit: Payer: Self-pay | Admitting: Family

## 2017-12-01 LAB — LIPID PANEL
CHOL/HDL RATIO: 3.1 ratio (ref 0.0–4.4)
Cholesterol, Total: 165 mg/dL (ref 100–199)
HDL: 54 mg/dL (ref >39)
LDL CALC: 81 mg/dL (ref 0–99)
Triglycerides: 151 mg/dL — ABNORMAL HIGH (ref 0–149)
VLDL Cholesterol Cal: 30 mg/dL (ref 5–40)

## 2017-12-01 LAB — CMP14+EGFR
ALBUMIN: 4.3 g/dL (ref 3.5–4.8)
ALK PHOS: 84 IU/L (ref 39–117)
ALT: 22 IU/L (ref 0–32)
AST: 26 IU/L (ref 0–40)
Albumin/Globulin Ratio: 1.3 (ref 1.2–2.2)
BUN / CREAT RATIO: 22 (ref 12–28)
BUN: 19 mg/dL (ref 8–27)
Bilirubin Total: 0.6 mg/dL (ref 0.0–1.2)
CO2: 28 mmol/L (ref 20–29)
CREATININE: 0.85 mg/dL (ref 0.57–1.00)
Calcium: 9.3 mg/dL (ref 8.7–10.3)
Chloride: 102 mmol/L (ref 96–106)
GFR calc Af Amer: 75 mL/min/{1.73_m2} (ref >59)
GFR calc non Af Amer: 65 mL/min/{1.73_m2} (ref >59)
GLOBULIN, TOTAL: 3.3 g/dL (ref 1.5–4.5)
Glucose: 93 mg/dL (ref 65–99)
Potassium: 4 mmol/L (ref 3.5–5.2)
SODIUM: 145 mmol/L — AB (ref 134–144)
Total Protein: 7.6 g/dL (ref 6.0–8.5)

## 2017-12-01 LAB — VITAMIN D 25 HYDROXY (VIT D DEFICIENCY, FRACTURES): VIT D 25 HYDROXY: 27.2 ng/mL — AB (ref 30.0–100.0)

## 2017-12-01 MED ORDER — VITAMIN D (ERGOCALCIFEROL) 1.25 MG (50000 UNIT) PO CAPS: 50000.0000 [IU] | ORAL_CAPSULE | ORAL | 3 refills | Status: DC

## 2017-12-12 ENCOUNTER — Other Ambulatory Visit: Payer: Self-pay | Admitting: Family

## 2018-02-08 ENCOUNTER — Other Ambulatory Visit: Payer: Self-pay | Admitting: Family Medicine

## 2018-02-08 ENCOUNTER — Other Ambulatory Visit: Payer: Self-pay | Admitting: Family

## 2018-03-31 ENCOUNTER — Telehealth: Payer: Self-pay | Admitting: Family

## 2018-05-02 ENCOUNTER — Other Ambulatory Visit: Payer: Self-pay | Admitting: Family

## 2018-05-02 DIAGNOSIS — K219 Gastro-esophageal reflux disease without esophagitis: Secondary | ICD-10-CM

## 2018-05-15 ENCOUNTER — Other Ambulatory Visit: Payer: Self-pay

## 2018-05-15 NOTE — Patient Outreach (Signed)
[place] [facility]) Care Management  05/15/2018  NIARA BUNKER 16-May-1941 [id number]   Medication Adherence call to Mrs. Tama Gander spoke with patient  she is still taking 1 tablet daily and has medication she said she will order it when finished patient is due on Losartan 40 mg and Benazepril / Hctz 20/25 mg. Mrs. Markert is showing past due under [place].   [place] Management Direct Dial [phone]  Fax [phone] [email or web address]

## 2018-05-19 ENCOUNTER — Ambulatory Visit (INDEPENDENT_AMBULATORY_CARE_PROVIDER_SITE_OTHER): Payer: Medicare Other

## 2018-05-19 DIAGNOSIS — Z23 Encounter for immunization: Secondary | ICD-10-CM

## 2018-05-29 ENCOUNTER — Other Ambulatory Visit: Payer: Self-pay

## 2018-05-29 NOTE — Patient Outreach (Signed)
[place] [facility]) Care Management  05/29/2018  PERSEPHANIE LAATSCH [birth year] [id number]   Medication Adherence call to [name] left a message for patient to call back patient is due on Lovastatin 40 mg and Benazepril /Hctz 20/25 mg. [name] is showing past due under [name].   [facility] Scott Management Direct Dial [phone]  Fax [phone] [email or web address]

## 2018-07-25 ENCOUNTER — Other Ambulatory Visit: Payer: Self-pay | Admitting: Family Medicine

## 2018-08-07 ENCOUNTER — Other Ambulatory Visit: Payer: Self-pay | Admitting: Family

## 2018-09-15 ENCOUNTER — Encounter: Payer: Self-pay | Admitting: Family

## 2018-09-15 ENCOUNTER — Ambulatory Visit (INDEPENDENT_AMBULATORY_CARE_PROVIDER_SITE_OTHER): Payer: Medicare Other | Admitting: Family

## 2018-09-15 VITALS — BP 159/78 | HR 68 | Temp 97.4°F | Ht 64.0 in | Wt 169.2 lb

## 2018-09-15 DIAGNOSIS — M545 Low back pain, unspecified: Secondary | ICD-10-CM

## 2018-09-15 DIAGNOSIS — I10 Essential (primary) hypertension: Secondary | ICD-10-CM

## 2018-09-15 DIAGNOSIS — M19011 Primary osteoarthritis, right shoulder: Secondary | ICD-10-CM

## 2018-09-15 DIAGNOSIS — K219 Gastro-esophageal reflux disease without esophagitis: Secondary | ICD-10-CM

## 2018-09-15 DIAGNOSIS — E559 Vitamin D deficiency, unspecified: Secondary | ICD-10-CM

## 2018-09-15 DIAGNOSIS — E785 Hyperlipidemia, unspecified: Secondary | ICD-10-CM

## 2018-09-15 DIAGNOSIS — G8929 Other chronic pain: Secondary | ICD-10-CM

## 2018-09-15 DIAGNOSIS — F411 Generalized anxiety disorder: Secondary | ICD-10-CM | POA: Diagnosis not present

## 2018-09-15 DIAGNOSIS — E663 Overweight: Secondary | ICD-10-CM

## 2018-09-15 MED ORDER — AMLODIPINE BESYLATE 5 MG PO TABS: 5.0000 mg | ORAL_TABLET | Freq: Every day | ORAL | 4 refills | Status: DC

## 2018-09-15 MED ORDER — OMEPRAZOLE 20 MG PO CPDR: 20.0000 mg | DELAYED_RELEASE_CAPSULE | Freq: Every day | ORAL | 3 refills | Status: DC

## 2018-09-15 MED ORDER — BENAZEPRIL-HYDROCHLOROTHIAZIDE 20-25 MG PO TABS: 1.0000 | ORAL_TABLET | Freq: Every day | ORAL | 3 refills | Status: DC

## 2018-09-15 MED ORDER — LOVASTATIN 40 MG PO TABS: 40.0000 mg | ORAL_TABLET | Freq: Every day | ORAL | 4 refills | Status: DC

## 2018-09-15 MED ORDER — ONDANSETRON 4 MG PO TBDP: ORAL_TABLET | ORAL | 2 refills | Status: DC

## 2018-09-15 NOTE — Patient Instructions (Signed)

## 2018-09-15 NOTE — Progress Notes (Signed)
Subjective:    Patient ID: Belinda Scott, female    DOB: 12/09/1940, 78 y.o.   MRN: [id number]  Chief Complaint  Patient presents with  . Medical Management of Chronic Issues   PT presents to the office today for chronic follow up.  Hypertension  This is a chronic problem. The current episode started more than 1 year ago. The problem has been waxing and waning since onset. The problem is uncontrolled. Associated symptoms include anxiety and peripheral edema ("some times in my left foot"). Pertinent negatives include no headaches or shortness of breath. Risk factors for coronary artery disease include obesity and sedentary lifestyle. The current treatment provides mild improvement.  Arthritis  Presents for follow-up visit. She complains of pain and stiffness. The symptoms have been stable. Affected locations include the right knee, left knee, right hip and left hip (back). Her pain is at a severity of 8/10.  Hyperlipidemia  This is a chronic problem. The current episode started more than 1 year ago. The problem is controlled. Recent lipid tests were reviewed and are normal. Exacerbating diseases include obesity. Pertinent negatives include no shortness of breath. Current antihyperlipidemic treatment includes statins. The current treatment provides moderate improvement of lipids. Risk factors for coronary artery disease include dyslipidemia, hypertension and a sedentary lifestyle.  Anxiety  Presents for follow-up visit. Symptoms include excessive worry, irritability and nervous/anxious behavior. Patient reports no decreased concentration, depressed mood or shortness of breath. Symptoms occur occasionally. The severity of symptoms is mild.    Back Pain  This is a chronic problem. The current episode started more than 1 year ago. The problem occurs intermittently. The pain is present in the lumbar spine. The pain is at a severity of 8/10. The pain is moderate. Pertinent negatives include no  headaches.      Review of Systems  Constitutional: Positive for irritability.  Respiratory: Negative for shortness of breath.   Musculoskeletal: Positive for arthritis, back pain and stiffness.  Neurological: Negative for headaches.  Psychiatric/Behavioral: Negative for decreased concentration. The patient is nervous/anxious.   All other systems reviewed and are negative.      Objective:   Physical Exam Vitals signs reviewed.  Constitutional:      General: She is not in acute distress.    Appearance: She is well-developed.  HENT:     Head: Normocephalic and atraumatic.     Right Ear: Tympanic membrane normal.     Left Ear: Tympanic membrane normal.  Eyes:     Pupils: Pupils are equal, round, and reactive to light.  Neck:     Musculoskeletal: Normal range of motion and neck supple.     Thyroid: No thyromegaly.  Cardiovascular:     Rate and Rhythm: Normal rate and regular rhythm.     Heart sounds: Normal heart sounds. No murmur.  Pulmonary:     Effort: Pulmonary effort is normal. No respiratory distress.     Breath sounds: Normal breath sounds. No wheezing.  Abdominal:     General: Bowel sounds are normal. There is no distension.     Palpations: Abdomen is soft.     Tenderness: There is no abdominal tenderness.  Musculoskeletal:        General: No tenderness.     Comments: Lumbar pain with flexion or extension  Skin:    General: Skin is warm and dry.  Neurological:     Mental Status: She is alert and oriented to person, place, and time.     Cranial  Nerves: No cranial nerve deficit.     Deep Tendon Reflexes: Reflexes are normal and symmetric.  Psychiatric:        Behavior: Behavior normal.        Thought Content: Thought content normal.        Judgment: Judgment normal.       BP (!) 161/79   Pulse 68   Temp (!) 97.4 F (36.3 C) (Oral)   Ht '5\' 4"'$  (1.626 m)   Wt 169 lb 3.2 oz (76.7 kg)   BMI 29.04 kg/m      Assessment & Plan:  Belinda Scott comes in  today with chief complaint of Medical Management of Chronic Issues   Diagnosis and orders addressed:  1. Essential hypertension - CMP14+EGFR - CBC with Differential/Platelet - amLODipine (NORVASC) 5 MG tablet; Take 1 tablet (5 mg total) by mouth daily. (Needs to be seen before next refill)  Dispense: 90 tablet; Refill: 4 - benazepril-hydrochlorthiazide (LOTENSIN HCT) 20-25 MG tablet; Take 1 tablet by mouth daily.  Dispense: 90 tablet; Refill: 3  2. Hyperlipidemia, unspecified hyperlipidemia type - CMP14+EGFR - CBC with Differential/Platelet - Lipid panel - lovastatin (MEVACOR) 40 MG tablet; Take 1 tablet (40 mg total) by mouth at bedtime. (Needs to be seen before next refill)  Dispense: 90 tablet; Refill: 4  3. GAD (generalized anxiety disorder) - CMP14+EGFR - CBC with Differential/Platelet  4. Overweight (BMI 25.0-29.9) - CMP14+EGFR - CBC with Differential/Platelet  5. Chronic bilateral low back pain, unspecified whether sciatica present - CMP14+EGFR - CBC with Differential/Platelet  6. Arthritis of right shoulder region - CMP14+EGFR - CBC with Differential/Platelet  7. Vitamin D deficiency - CMP14+EGFR - CBC with Differential/Platelet - VITAMIN D 25 Hydroxy (Vit-D Deficiency, Fractures)  8. Gastroesophageal reflux disease, esophagitis presence not specified Discusses that she will start taking Prilosec daily to see if this helps her abdominal pain - omeprazole (PRILOSEC) 20 MG capsule; Take 1 capsule (20 mg total) by mouth daily.  Dispense: 90 capsule; Refill: 3 - ondansetron (ZOFRAN-ODT) 4 MG disintegrating tablet; DISSOLVE 1 TABLET IN MOUTH EVERY 8 HOURS AS NEEDED FOR NAUSEA OR  VOMITING  Dispense: 20 tablet; Refill: 2   Labs pending Health Maintenance reviewed Diet and exercise encouraged  Follow up plan: 6 months    Evelina Dun, FNP

## 2018-09-16 LAB — CBC WITH DIFFERENTIAL/PLATELET
BASOS ABS: 0.1 10*3/uL (ref 0.0–0.2)
Basos: 1 %
EOS (ABSOLUTE): 0.1 10*3/uL (ref 0.0–0.4)
Eos: 1 %
HEMOGLOBIN: 15.4 g/dL (ref 11.1–15.9)
Hematocrit: 45.4 % (ref 34.0–46.6)
Immature Grans (Abs): 0 10*3/uL (ref 0.0–0.1)
Immature Granulocytes: 0 %
Lymphocytes Absolute: 3.6 10*3/uL — ABNORMAL HIGH (ref 0.7–3.1)
Lymphs: 40 %
MCH: 30.6 pg (ref 26.6–33.0)
MCHC: 33.9 g/dL (ref 31.5–35.7)
MCV: 90 fL (ref 79–97)
MONOCYTES: 7 %
Monocytes Absolute: 0.7 10*3/uL (ref 0.1–0.9)
NEUTROS ABS: 4.5 10*3/uL (ref 1.4–7.0)
Neutrophils: 51 %
Platelets: 229 10*3/uL (ref 150–450)
RBC: 5.03 x10E6/uL (ref 3.77–5.28)
RDW: 13 % (ref 11.7–15.4)
WBC: 8.9 10*3/uL (ref 3.4–10.8)

## 2018-09-16 LAB — CMP14+EGFR
A/G RATIO: 1.5 (ref 1.2–2.2)
ALBUMIN: 4.5 g/dL (ref 3.7–4.7)
ALK PHOS: 81 IU/L (ref 39–117)
ALT: 22 IU/L (ref 0–32)
AST: 29 IU/L (ref 0–40)
BILIRUBIN TOTAL: 0.6 mg/dL (ref 0.0–1.2)
BUN / CREAT RATIO: 17 (ref 12–28)
BUN: 15 mg/dL (ref 8–27)
CHLORIDE: 102 mmol/L (ref 96–106)
CO2: 24 mmol/L (ref 20–29)
Calcium: 9.5 mg/dL (ref 8.7–10.3)
Creatinine, Ser: 0.9 mg/dL (ref 0.57–1.00)
GFR calc non Af Amer: 61 mL/min/{1.73_m2} (ref >59)
GFR, EST AFRICAN AMERICAN: 70 mL/min/{1.73_m2} (ref >59)
Globulin, Total: 3.1 g/dL (ref 1.5–4.5)
Glucose: 93 mg/dL (ref 65–99)
POTASSIUM: 3.8 mmol/L (ref 3.5–5.2)
SODIUM: 145 mmol/L — AB (ref 134–144)
TOTAL PROTEIN: 7.6 g/dL (ref 6.0–8.5)

## 2018-09-16 LAB — LIPID PANEL
CHOLESTEROL TOTAL: 172 mg/dL (ref 100–199)
Chol/HDL Ratio: 3 ratio (ref 0.0–4.4)
HDL: 58 mg/dL (ref >39)
LDL CALC: 87 mg/dL (ref 0–99)
Triglycerides: 137 mg/dL (ref 0–149)
VLDL Cholesterol Cal: 27 mg/dL (ref 5–40)

## 2018-09-16 LAB — VITAMIN D 25 HYDROXY (VIT D DEFICIENCY, FRACTURES): Vit D, 25-Hydroxy: 31.5 ng/mL (ref 30.0–100.0)

## 2018-11-10 DIAGNOSIS — H40033 Anatomical narrow angle, bilateral: Secondary | ICD-10-CM | POA: Diagnosis not present

## 2018-11-10 DIAGNOSIS — H2513 Age-related nuclear cataract, bilateral: Secondary | ICD-10-CM | POA: Diagnosis not present

## 2019-01-02 ENCOUNTER — Other Ambulatory Visit: Payer: Self-pay

## 2019-01-02 NOTE — Patient Outreach (Signed)
[facility]) Care Management  01/02/2019  CANNIE MUCKLE April 30, 1941 [id number]   Medication Adherence call to Mrs. Tama Gander Hippa Identifiers Verify spoke with patient she is due on Lovastatin 40 mg and Benazepril/Hctz 20/25/ patient explains she is taking 1 tablet daily and she knows how to take her medication she said she has plenty at this time and when need it she will call the pharmacy Mrs. Font is showing past due under [place].   [place] Management Direct Dial [phone]  Fax [phone] [email or web address]

## 2019-02-15 DIAGNOSIS — X32XXXD Exposure to sunlight, subsequent encounter: Secondary | ICD-10-CM | POA: Diagnosis not present

## 2019-02-15 DIAGNOSIS — L570 Actinic keratosis: Secondary | ICD-10-CM | POA: Diagnosis not present

## 2019-02-15 DIAGNOSIS — D0461 Carcinoma in situ of skin of right upper limb, including shoulder: Secondary | ICD-10-CM | POA: Diagnosis not present

## 2019-04-16 ENCOUNTER — Other Ambulatory Visit: Payer: Self-pay

## 2019-04-16 NOTE — Patient Outreach (Signed)
[place] [facility]) Care Management  04/16/2019  Belinda Scott 04-Jul-1941 [id number]   Medication Adherence call to Belinda Scott HIPPA Compliant Voice message left with a call back number. Belinda Scott is showing past due on Benazepril/Hctz 20/25 mg under [place].   [place] Management Direct Dial [phone]  Fax [phone] [email or web address]

## 2019-05-09 ENCOUNTER — Other Ambulatory Visit: Payer: Self-pay

## 2019-05-09 NOTE — Patient Outreach (Signed)
[facility]) Care Management  05/09/2019  Belinda Scott 1941-05-30 [id number]  Medication Adherence call to Belinda Scott HIPPA Compliant Voice message left with a call back number. Belinda Scott is showing past due on Lovastatin 40 mg under [place].   [place] Management Direct Dial [phone]  Fax [phone] [email or web address]

## 2019-05-28 ENCOUNTER — Other Ambulatory Visit: Payer: Self-pay

## 2019-05-29 ENCOUNTER — Ambulatory Visit (INDEPENDENT_AMBULATORY_CARE_PROVIDER_SITE_OTHER): Payer: Medicare Other | Admitting: Family

## 2019-05-29 ENCOUNTER — Encounter: Payer: Self-pay | Admitting: Family

## 2019-05-29 ENCOUNTER — Other Ambulatory Visit: Payer: Self-pay

## 2019-05-29 VITALS — BP 144/71 | HR 63 | Temp 96.2°F | Ht 64.0 in | Wt 165.2 lb

## 2019-05-29 DIAGNOSIS — I10 Essential (primary) hypertension: Secondary | ICD-10-CM

## 2019-05-29 DIAGNOSIS — E663 Overweight: Secondary | ICD-10-CM

## 2019-05-29 DIAGNOSIS — E559 Vitamin D deficiency, unspecified: Secondary | ICD-10-CM

## 2019-05-29 DIAGNOSIS — M159 Polyosteoarthritis, unspecified: Secondary | ICD-10-CM

## 2019-05-29 DIAGNOSIS — M545 Low back pain, unspecified: Secondary | ICD-10-CM

## 2019-05-29 DIAGNOSIS — Z79899 Other long term (current) drug therapy: Secondary | ICD-10-CM | POA: Diagnosis not present

## 2019-05-29 DIAGNOSIS — G8929 Other chronic pain: Secondary | ICD-10-CM

## 2019-05-29 DIAGNOSIS — Z23 Encounter for immunization: Secondary | ICD-10-CM

## 2019-05-29 DIAGNOSIS — E785 Hyperlipidemia, unspecified: Secondary | ICD-10-CM | POA: Diagnosis not present

## 2019-05-29 DIAGNOSIS — F411 Generalized anxiety disorder: Secondary | ICD-10-CM

## 2019-05-29 DIAGNOSIS — F1129 Opioid dependence with unspecified opioid-induced disorder: Secondary | ICD-10-CM

## 2019-05-29 MED ORDER — HYDROCODONE-ACETAMINOPHEN 5-325 MG PO TABS: 1.0000 | ORAL_TABLET | Freq: Two times a day (BID) | ORAL | 0 refills | Status: DC | PRN

## 2019-05-29 NOTE — Progress Notes (Signed)
Subjective:    Patient ID: Belinda Scott, female    DOB: 1941/06/04, 78 y.o.   MRN: [id number]  Chief Complaint  Patient presents with  . Medical Management of Chronic Issues    six month recheck   PT presents to the office today for chronic follow up. Hypertension This is a chronic problem. The current episode started more than 1 year ago. The problem has been waxing and waning since onset. The problem is controlled. Associated symptoms include anxiety and peripheral edema ("a little at times"). Pertinent negatives include no headaches, malaise/fatigue or shortness of breath. Risk factors for coronary artery disease include sedentary lifestyle. Past treatments include calcium channel blockers, diuretics and ACE inhibitors. The current treatment provides moderate improvement. There is no history of CVA or heart failure.  Hyperlipidemia This is a chronic problem. The current episode started more than 1 year ago. The problem is controlled. Recent lipid tests were reviewed and are normal. Exacerbating diseases include obesity. Pertinent negatives include no shortness of breath. The current treatment provides moderate improvement of lipids. Risk factors for coronary artery disease include dyslipidemia, hypertension, a sedentary lifestyle and post-menopausal.  Gastroesophageal Reflux She complains of belching and heartburn. This is a chronic problem. The current episode started more than 1 year ago. The problem occurs occasionally. The problem has been waxing and waning. Risk factors include obesity. She has tried a PPI for the symptoms. The treatment provided mild relief.  Anxiety Presents for follow-up visit. Symptoms include depressed mood, excessive worry, irritability and nervous/anxious behavior. Patient reports no shortness of breath. Symptoms occur occasionally. The severity of symptoms is moderate.    Arthritis Presents for follow-up visit. She complains of pain and stiffness. The  symptoms have been stable. Affected locations include the right foot and left foot (back). Her pain is at a severity of 10/10.      Review of Systems  Constitutional: Positive for irritability. Negative for malaise/fatigue.  Respiratory: Negative for shortness of breath.   Gastrointestinal: Positive for heartburn.  Musculoskeletal: Positive for arthritis and stiffness.  Neurological: Negative for headaches.  Psychiatric/Behavioral: The patient is nervous/anxious.   All other systems reviewed and are negative.      Objective:   Physical Exam Vitals signs reviewed.  Constitutional:      General: She is not in acute distress.    Appearance: She is well-developed.  HENT:     Head: Normocephalic and atraumatic.     Right Ear: Tympanic membrane normal.     Left Ear: Tympanic membrane normal.  Eyes:     Pupils: Pupils are equal, round, and reactive to light.  Neck:     Musculoskeletal: Normal range of motion and neck supple.     Thyroid: No thyromegaly.  Cardiovascular:     Rate and Rhythm: Normal rate and regular rhythm.     Heart sounds: Normal heart sounds. No murmur.  Pulmonary:     Effort: Pulmonary effort is normal. No respiratory distress.     Breath sounds: Normal breath sounds. No wheezing.  Abdominal:     General: Bowel sounds are normal. There is no distension.     Palpations: Abdomen is soft.     Tenderness: There is no abdominal tenderness.  Musculoskeletal: Normal range of motion.        General: No tenderness.  Skin:    General: Skin is warm and dry.  Neurological:     Mental Status: She is alert and oriented to person, place, and time.  Cranial Nerves: No cranial nerve deficit.     Deep Tendon Reflexes: Reflexes are normal and symmetric.  Psychiatric:        Behavior: Behavior normal.        Thought Content: Thought content normal.        Judgment: Judgment normal.       BP (!) 144/71   Pulse 63   Temp (!) 96.2 F (35.7 C) (Temporal)   Ht 5'  4" (1.626 m)   Wt 165 lb 3.2 oz (74.9 kg)   SpO2 99%   BMI 28.36 kg/m      Assessment & Plan:  JADA FASS comes in today with chief complaint of Medical Management of Chronic Issues (six month recheck)   Diagnosis and orders addressed:  1. Essential hypertension - CMP14+EGFR - CBC with Differential/Platelet  2. Overweight (BMI 25.0-29.9) - CMP14+EGFR - CBC with Differential/Platelet  3. Vitamin D deficiency - CMP14+EGFR - CBC with Differential/Platelet - VITAMIN D 25 Hydroxy (Vit-D Deficiency, Fractures)  4. Hyperlipidemia, unspecified hyperlipidemia type - CMP14+EGFR - CBC with Differential/Platelet  5. GAD (generalized anxiety disorder) - CMP14+EGFR - CBC with Differential/Platelet  6. Chronic bilateral low back pain, unspecified whether sciatica present - CMP14+EGFR - CBC with Differential/Platelet  7. Osteoarthritis of multiple joints, unspecified osteoarthritis type - CMP14+EGFR - CBC with Differential/Platelet  8. Controlled substance agreement signed -Will start patient today on Norco Pt reviewed in [place] controlled database- No red flags noted - CMP14+EGFR - CBC with Differential/Platelet - ToxASSURE Select 13 (MW), Urine - HYDROcodone-acetaminophen (NORCO) 5-325 MG tablet; Take 1 tablet by mouth every 12 (twelve) hours as needed for moderate pain.  Dispense: 60 tablet; Refill: 0 - HYDROcodone-acetaminophen (NORCO) 5-325 MG tablet; Take 1 tablet by mouth every 12 (twelve) hours as needed for moderate pain.  Dispense: 60 tablet; Refill: 0 - HYDROcodone-acetaminophen (NORCO) 5-325 MG tablet; Take 1 tablet by mouth every 12 (twelve) hours as needed for moderate pain.  Dispense: 60 tablet; Refill: 0  9. Opioid dependence with opioid-induced disorder (HCC) - CMP14+EGFR - CBC with Differential/Platelet - ToxASSURE Select 13 (MW), Urine - HYDROcodone-acetaminophen (NORCO) 5-325 MG tablet; Take 1 tablet by mouth every 12 (twelve) hours as needed for moderate  pain.  Dispense: 60 tablet; Refill: 0 - HYDROcodone-acetaminophen (NORCO) 5-325 MG tablet; Take 1 tablet by mouth every 12 (twelve) hours as needed for moderate pain.  Dispense: 60 tablet; Refill: 0 - HYDROcodone-acetaminophen (NORCO) 5-325 MG tablet; Take 1 tablet by mouth every 12 (twelve) hours as needed for moderate pain.  Dispense: 60 tablet; Refill: 0   Labs pending Health Maintenance reviewed Diet and exercise encouraged  Follow up plan: 3 months    Evelina Dun, FNP

## 2019-05-29 NOTE — Patient Instructions (Signed)

## 2019-05-30 LAB — CBC WITH DIFFERENTIAL/PLATELET
Basophils Absolute: 0.1 10*3/uL (ref 0.0–0.2)
Basos: 1 %
EOS (ABSOLUTE): 0.1 10*3/uL (ref 0.0–0.4)
Eos: 2 %
Hematocrit: 47.3 % — ABNORMAL HIGH (ref 34.0–46.6)
Hemoglobin: 15.6 g/dL (ref 11.1–15.9)
Immature Grans (Abs): 0 10*3/uL (ref 0.0–0.1)
Immature Granulocytes: 0 %
Lymphocytes Absolute: 3.4 10*3/uL — ABNORMAL HIGH (ref 0.7–3.1)
Lymphs: 44 %
MCH: 30 pg (ref 26.6–33.0)
MCHC: 33 g/dL (ref 31.5–35.7)
MCV: 91 fL (ref 79–97)
Monocytes Absolute: 0.6 10*3/uL (ref 0.1–0.9)
Monocytes: 8 %
Neutrophils Absolute: 3.4 10*3/uL (ref 1.4–7.0)
Neutrophils: 45 %
Platelets: 212 10*3/uL (ref 150–450)
RBC: 5.2 x10E6/uL (ref 3.77–5.28)
RDW: 13.1 % (ref 11.7–15.4)
WBC: 7.5 10*3/uL (ref 3.4–10.8)

## 2019-05-30 LAB — CMP14+EGFR
ALT: 22 IU/L (ref 0–32)
AST: 26 IU/L (ref 0–40)
Albumin/Globulin Ratio: 1.4 (ref 1.2–2.2)
Albumin: 4.6 g/dL (ref 3.7–4.7)
Alkaline Phosphatase: 76 IU/L (ref 39–117)
BUN/Creatinine Ratio: 21 (ref 12–28)
BUN: 16 mg/dL (ref 8–27)
Bilirubin Total: 0.6 mg/dL (ref 0.0–1.2)
CO2: 25 mmol/L (ref 20–29)
Calcium: 9.5 mg/dL (ref 8.7–10.3)
Chloride: 101 mmol/L (ref 96–106)
Creatinine, Ser: 0.75 mg/dL (ref 0.57–1.00)
GFR calc Af Amer: 87 mL/min/{1.73_m2} (ref >59)
GFR calc non Af Amer: 76 mL/min/{1.73_m2} (ref >59)
Globulin, Total: 3.2 g/dL (ref 1.5–4.5)
Glucose: 108 mg/dL — ABNORMAL HIGH (ref 65–99)
Potassium: 4 mmol/L (ref 3.5–5.2)
Sodium: 143 mmol/L (ref 134–144)
Total Protein: 7.8 g/dL (ref 6.0–8.5)

## 2019-05-30 LAB — VITAMIN D 25 HYDROXY (VIT D DEFICIENCY, FRACTURES): Vit D, 25-Hydroxy: 34.9 ng/mL (ref 30.0–100.0)

## 2019-06-01 LAB — TOXASSURE SELECT 13 (MW), URINE

## 2019-06-11 ENCOUNTER — Telehealth: Payer: Self-pay | Admitting: Family

## 2019-06-11 DIAGNOSIS — F1129 Opioid dependence with unspecified opioid-induced disorder: Secondary | ICD-10-CM

## 2019-06-11 DIAGNOSIS — Z79899 Other long term (current) drug therapy: Secondary | ICD-10-CM

## 2019-06-11 NOTE — Telephone Encounter (Signed)
Patient was given 6 pills and needs a prior auth for hydrocodone

## 2019-06-12 NOTE — Telephone Encounter (Addendum)
Prior Auth for Hydrocodone-Acetaminophen 5-325-APPROVED  Hydrocodone-acetaminophen 5-325mg  tablet, use as directed, is approved for use beyond a 7-day supply safety limit for new opioid users through 07/12/2019 under your Medicare Part D benefit. Reviewed by: Alicia Amel.D. **Please note: If you continue to take this drug on a regular basis (such as monthly), you do not need another authorization for the safety edit that limits new opioid users to a 7-day supply.   Key: [id number]   PA Case ID: [id number]   [facility] is reviewing your PA request. Typically an electronic response will be received within 72 hours. To check for an update later, open this request from your dashboard.  You may close this dialog and return to your dashboard to perform other tasks.  Pharmacy notified.

## 2019-06-14 ENCOUNTER — Telehealth: Payer: Self-pay | Admitting: Family

## 2019-06-14 MED ORDER — HYDROCODONE-ACETAMINOPHEN 5-325 MG PO TABS: 1.0000 | ORAL_TABLET | Freq: Two times a day (BID) | ORAL | 0 refills | Status: DC | PRN

## 2019-06-14 NOTE — Telephone Encounter (Signed)
Prescription sent to pharmacy.

## 2019-06-14 NOTE — Telephone Encounter (Signed)
Talk to nurse °

## 2019-06-14 NOTE — Telephone Encounter (Signed)
PA for pain medicine was approved but when I called the pharmacy they said where they had to give her a 7 day supply the original rx is no longer good. They need a new rx for the remainder of the pills which would be #46. Can you please send that over to [facility] in [place].

## 2019-06-14 NOTE — Telephone Encounter (Signed)
TC from [facility] Initial Rx for Hydrocodone Acetaminophen 5/325 needed a PA Pt was able to get 7 days until PA was approved Pt needs a new Rx to get remaining Rx filled till the next dated prescription

## 2019-06-15 ENCOUNTER — Telehealth: Payer: Self-pay | Admitting: Family

## 2019-06-15 NOTE — Telephone Encounter (Signed)
This was sent yesterday. Can we call the pharmacy and see what the problem is? Thanks

## 2019-06-15 NOTE — Telephone Encounter (Signed)
WM and PT called and aware - RX ready for pick up

## 2019-08-01 ENCOUNTER — Other Ambulatory Visit: Payer: Self-pay

## 2019-08-01 NOTE — Patient Outreach (Signed)
[facility] [facility]) Care Management  08/01/2019  NATILEY RENE 09-Jan-1941 [id number]   Medication Adherence call to Mrs. Tama Gander Hippa Identifiers Verify spoke with patient she is past due on Benazepril/Hctz,patient explain she takes 1 tablet daily,patient said some times she misses a dose but is trying to take on a regular basis and has already pick up from the pharmacy ,patient has plenty at this time. Mrs. Shanor is showing past due under [place].   [place] Management Direct Dial [phone]  Fax [phone] [email or web address]

## 2019-08-21 ENCOUNTER — Telehealth: Payer: Self-pay | Admitting: Family

## 2019-08-21 NOTE — Telephone Encounter (Signed)
What is the name of the medication?  HYDROcodone-acetaminophen (NORCO) 5-325 MG tablet  HYDROcodone-acetaminophen (NORCO) 5-325 MG tablet    Have you contacted your pharmacy to request a refill? no  Which pharmacy would you like this sent to? [facility] in [facility]   Patient notified that their request is being sent to the clinical staff for review and that they should receive a call once it is complete. If they do not receive a call within 24 hours they can check with their pharmacy or our office.

## 2019-08-21 NOTE — Telephone Encounter (Signed)
Current Outpatient Medications on File Prior to Visit  Medication Sig Dispense Refill  . amLODipine (NORVASC) 5 MG tablet Take 1 tablet (5 mg total) by mouth daily. (Needs to be seen before next refill) 90 tablet 4  . Ascorbic Acid 500 MG CHEW Chew 1 each by mouth daily.     . benazepril-hydrochlorthiazide (LOTENSIN HCT) 20-25 MG tablet Take 1 tablet by mouth daily. 90 tablet 3  . Cholecalciferol (VITAMIN D3) 2000 units CHEW Chew 1 each by mouth 2 (two) times a week. 30 tablet 1  . HYDROcodone-acetaminophen (NORCO) 5-325 MG tablet Take 1 tablet by mouth every 12 (twelve) hours as needed for moderate pain. 60 tablet 0  . HYDROcodone-acetaminophen (NORCO) 5-325 MG tablet Take 1 tablet by mouth every 12 (twelve) hours as needed for moderate pain. 60 tablet 0  . HYDROcodone-acetaminophen (NORCO) 5-325 MG tablet Take 1 tablet by mouth every 12 (twelve) hours as needed for moderate pain. 60 tablet 0  . lovastatin (MEVACOR) 40 MG tablet Take 1 tablet (40 mg total) by mouth at bedtime. (Needs to be seen before next refill) 90 tablet 4  . omeprazole (PRILOSEC) 20 MG capsule Take 1 capsule (20 mg total) by mouth daily. 90 capsule 3  . ondansetron (ZOFRAN-ODT) 4 MG disintegrating tablet DISSOLVE 1 TABLET IN MOUTH EVERY 8 HOURS AS NEEDED FOR NAUSEA OR  VOMITING 20 tablet 2  . OVER THE COUNTER MEDICATION     . Potassium 99 MG TABS Take 99 mg by mouth. Taking four times a week.   Over the counter medication.    . Probiotic Product (RA PROBIOTIC GUMMIES) CHEW Chew by mouth.     No current facility-administered medications on file prior to visit.

## 2019-08-21 NOTE — Telephone Encounter (Signed)
Appointment made

## 2019-08-27 ENCOUNTER — Encounter: Payer: Self-pay | Admitting: Family

## 2019-08-27 ENCOUNTER — Ambulatory Visit (INDEPENDENT_AMBULATORY_CARE_PROVIDER_SITE_OTHER): Payer: Medicare Other | Admitting: Family

## 2019-08-27 ENCOUNTER — Other Ambulatory Visit: Payer: Self-pay

## 2019-08-27 VITALS — BP 150/67 | HR 70 | Temp 99.5°F | Ht 64.0 in | Wt 163.0 lb

## 2019-08-27 DIAGNOSIS — E785 Hyperlipidemia, unspecified: Secondary | ICD-10-CM

## 2019-08-27 DIAGNOSIS — F411 Generalized anxiety disorder: Secondary | ICD-10-CM | POA: Diagnosis not present

## 2019-08-27 DIAGNOSIS — E559 Vitamin D deficiency, unspecified: Secondary | ICD-10-CM

## 2019-08-27 DIAGNOSIS — M159 Polyosteoarthritis, unspecified: Secondary | ICD-10-CM | POA: Diagnosis not present

## 2019-08-27 DIAGNOSIS — G8929 Other chronic pain: Secondary | ICD-10-CM

## 2019-08-27 DIAGNOSIS — I10 Essential (primary) hypertension: Secondary | ICD-10-CM | POA: Diagnosis not present

## 2019-08-27 DIAGNOSIS — M545 Low back pain: Secondary | ICD-10-CM | POA: Diagnosis not present

## 2019-08-27 DIAGNOSIS — Z0289 Encounter for other administrative examinations: Secondary | ICD-10-CM

## 2019-08-27 DIAGNOSIS — F1120 Opioid dependence, uncomplicated: Secondary | ICD-10-CM

## 2019-08-27 DIAGNOSIS — E663 Overweight: Secondary | ICD-10-CM

## 2019-08-27 MED ORDER — HYDROCODONE-ACETAMINOPHEN 5-325 MG PO TABS: 1.0000 | ORAL_TABLET | Freq: Two times a day (BID) | ORAL | 0 refills | Status: DC | PRN

## 2019-08-27 NOTE — Patient Instructions (Signed)

## 2019-08-27 NOTE — Progress Notes (Signed)
Subjective:    Patient ID: Belinda Scott, female    DOB: 09-02-1940, 79 y.o.   MRN: [id number]  Chief Complaint  Patient presents with  . pain management   Pt presents to the office today for chronic follow up and pain management.  Hypertension This is a chronic problem. The current episode started more than 1 year ago. The problem has been waxing and waning since onset. The problem is controlled. Associated symptoms include anxiety. Pertinent negatives include no malaise/fatigue, peripheral edema or shortness of breath. Risk factors for coronary artery disease include dyslipidemia and obesity. Past treatments include calcium channel blockers and lifestyle changes. The current treatment provides moderate improvement. There is no history of CAD/MI or heart failure.  Arthritis Presents for follow-up visit. She complains of pain and joint swelling. The symptoms have been stable. Affected locations include the right knee and left knee (back). Her pain is at a severity of 10/10.  Hyperlipidemia This is a chronic problem. The current episode started more than 1 year ago. The problem is controlled. Recent lipid tests were reviewed and are normal. Exacerbating diseases include obesity. Pertinent negatives include no leg pain or shortness of breath. Current antihyperlipidemic treatment includes statins. The current treatment provides moderate improvement of lipids. Risk factors for coronary artery disease include hypertension, post-menopausal and a sedentary lifestyle.  Anxiety Presents for follow-up visit. Symptoms include depressed mood, excessive worry, irritability and nervous/anxious behavior. Patient reports no shortness of breath. Symptoms occur occasionally. The severity of symptoms is moderate.    Back Pain This is a chronic problem. The current episode started more than 1 year ago. The problem occurs intermittently. The problem has been waxing and waning since onset. The pain is present in  the lumbar spine. The quality of the pain is described as aching. The pain is at a severity of 10/10. The pain is moderate. Pertinent negatives include no leg pain, tingling or weakness. Risk factors include obesity. She has tried analgesics for the symptoms. The treatment provided mild relief.  Gastroesophageal Reflux She complains of belching and heartburn. This is a chronic problem. The current episode started more than 1 year ago. The problem occurs occasionally. The problem has been waxing and waning. She has tried a PPI for the symptoms. The treatment provided moderate relief.      Review of Systems  Constitutional: Positive for irritability. Negative for malaise/fatigue.  Respiratory: Negative for shortness of breath.   Gastrointestinal: Positive for heartburn.  Musculoskeletal: Positive for arthritis, back pain and joint swelling.  Neurological: Negative for tingling and weakness.  Psychiatric/Behavioral: The patient is nervous/anxious.   All other systems reviewed and are negative.      Objective:   Physical Exam Vitals reviewed.  Constitutional:      General: She is not in acute distress.    Appearance: She is well-developed.  HENT:     Head: Normocephalic and atraumatic.     Right Ear: Tympanic membrane normal.     Left Ear: Tympanic membrane normal.  Eyes:     Pupils: Pupils are equal, round, and reactive to light.  Neck:     Thyroid: No thyromegaly.  Cardiovascular:     Rate and Rhythm: Normal rate and regular rhythm.     Heart sounds: Normal heart sounds. No murmur.  Pulmonary:     Effort: Pulmonary effort is normal. No respiratory distress.     Breath sounds: Normal breath sounds. No wheezing.  Abdominal:     General: Bowel sounds  are normal. There is no distension.     Palpations: Abdomen is soft.     Tenderness: There is no abdominal tenderness.  Musculoskeletal:        General: Tenderness present. Normal range of motion.     Cervical back: Normal range of  motion and neck supple.     Comments: Mild pain in lower lumbar  With flexion  Skin:    General: Skin is warm and dry.  Neurological:     Mental Status: She is alert and oriented to person, place, and time.     Cranial Nerves: No cranial nerve deficit.     Deep Tendon Reflexes: Reflexes are normal and symmetric.  Psychiatric:        Behavior: Behavior normal.        Thought Content: Thought content normal.        Judgment: Judgment normal.     BP (!) 150/67   Pulse 70   Temp 99.5 F (37.5 C) (Temporal)   Ht 5\' 4"  (1.626 m)   Wt 163 lb (73.9 kg)   SpO2 96%   BMI 27.98 kg/m        Assessment & Plan:  SHARNESE URETA comes in today with chief complaint of pain management   Diagnosis and orders addressed:  1. Essential hypertension  2. Osteoarthritis of multiple joints, unspecified osteoarthritis type - HYDROcodone-acetaminophen (NORCO) 5-325 MG tablet; Take 1 tablet by mouth every 12 (twelve) hours as needed for moderate pain.  Dispense: 60 tablet; Refill: 0 - HYDROcodone-acetaminophen (NORCO) 5-325 MG tablet; Take 1 tablet by mouth every 12 (twelve) hours as needed for moderate pain.  Dispense: 60 tablet; Refill: 0 - HYDROcodone-acetaminophen (NORCO) 5-325 MG tablet; Take 1 tablet by mouth every 12 (twelve) hours as needed for moderate pain.  Dispense: 60 tablet; Refill: 0 - DRUG SCREEN-TOXASSURE  3. GAD (generalized anxiety disorder)  4. Overweight (BMI 25.0-29.9)  5. Chronic bilateral low back pain, unspecified whether sciatica present  6. Hyperlipidemia, unspecified hyperlipidemia type  7. Vitamin D deficiency  8. Pain management contract signed - HYDROcodone-acetaminophen (NORCO) 5-325 MG tablet; Take 1 tablet by mouth every 12 (twelve) hours as needed for moderate pain.  Dispense: 60 tablet; Refill: 0 - HYDROcodone-acetaminophen (NORCO) 5-325 MG tablet; Take 1 tablet by mouth every 12 (twelve) hours as needed for moderate pain.  Dispense: 60 tablet; Refill:  0 - HYDROcodone-acetaminophen (NORCO) 5-325 MG tablet; Take 1 tablet by mouth every 12 (twelve) hours as needed for moderate pain.  Dispense: 60 tablet; Refill: 0 - DRUG SCREEN-TOXASSURE  9. Uncomplicated opioid dependence (HCC) - HYDROcodone-acetaminophen (NORCO) 5-325 MG tablet; Take 1 tablet by mouth every 12 (twelve) hours as needed for moderate pain.  Dispense: 60 tablet; Refill: 0 - HYDROcodone-acetaminophen (NORCO) 5-325 MG tablet; Take 1 tablet by mouth every 12 (twelve) hours as needed for moderate pain.  Dispense: 60 tablet; Refill: 0 - HYDROcodone-acetaminophen (NORCO) 5-325 MG tablet; Take 1 tablet by mouth every 12 (twelve) hours as needed for moderate pain.  Dispense: 60 tablet; Refill: 0 - DRUG SCREEN-TOXASSURE  Drug contracted UTD Labs pending Health Maintenance reviewed Diet and exercise encouraged  Follow up plan:  3 months  Evelina Dun, FNP

## 2019-08-29 LAB — TOXASSURE SELECT 13 (MW), URINE

## 2019-10-03 ENCOUNTER — Encounter: Payer: Self-pay | Admitting: Family

## 2019-10-03 ENCOUNTER — Ambulatory Visit (INDEPENDENT_AMBULATORY_CARE_PROVIDER_SITE_OTHER): Payer: Medicare Other | Admitting: Family

## 2019-10-03 NOTE — Progress Notes (Signed)
Pt called thinking she needed a visit to get pain medication. She had visit on 01/04/221 and three month of pain medication was sent. Told to call [facility].

## 2019-11-12 ENCOUNTER — Other Ambulatory Visit: Payer: Self-pay | Admitting: Family

## 2019-11-12 DIAGNOSIS — I10 Essential (primary) hypertension: Secondary | ICD-10-CM

## 2019-12-06 ENCOUNTER — Telehealth: Payer: Self-pay | Admitting: Family

## 2019-12-06 NOTE — Chronic Care Management (AMB) (Signed)
  Chronic Care Management   Outreach Note  12/06/2019 Name: STEWART GOSSMAN MRN: [id number] DOB: 10/17/1940  CHERRELL VOCK is a 79 y.o. year old female who is a primary care patient of Sharion Balloon, FNP. I reached out to Su Ley by phone today in response to a referral sent by Ms. Wyman Songster Habermehl's health plan.     An unsuccessful telephone outreach was attempted today. The patient was referred to the case management team for assistance with care management and care coordination.   Follow Up Plan: A HIPPA compliant phone message was left for the patient providing contact information and requesting a return call.  The care management team will reach out to the patient again over the next 7 days. If patient returns call to provider office, please advise to call [place] at [phone].  [place], [place] [address] Direct Dial: [phone] [email or web address] Website: [email or web address]

## 2019-12-06 NOTE — Chronic Care Management (AMB) (Signed)
  Chronic Care Management   Note  12/06/2019 Name: Belinda Scott MRN: [id number] DOB: [birth year]  Belinda Scott is a 78 y.o. year old female who is a primary care patient of Sharion Balloon, FNP. I reached out to Belinda Scott by phone today in response to a referral sent by Belinda Scott's health plan.     Belinda Scott was given information about Chronic Care Management services today including:  1. CCM service includes personalized support from designated clinical staff supervised by her physician, including individualized plan of care and coordination with other care providers 2. 24/7 contact phone numbers for assistance for urgent and routine care needs. 3. Service will only be billed when office clinical staff spend 20 minutes or more in a month to coordinate care. 4. Only one practitioner may furnish and bill the service in a calendar month. 5. The patient may stop CCM services at any time (effective at the end of the month) by phone call to the office staff. 6. The patient will be responsible for cost sharing (co-pay) of up to 20% of the service fee (after annual deductible is met).  Patient did not agree to enrollment in care management services and does not wish to consider at this time.  Follow up plan: The care management team is available to follow up with the patient after provider conversation with the patient regarding recommendation for care management engagement and subsequent re-referral to the care management team. The patient has been provided with contact information for the care management team and has been advised to call with any health related questions or concerns. If patient returns call to provider office, please advise to call [place] at [phone].  [place], [place] [address] Direct Dial:  [phone] [email or web address] Website: [email or web address]

## 2019-12-16 ENCOUNTER — Other Ambulatory Visit: Payer: Self-pay | Admitting: Family

## 2019-12-16 DIAGNOSIS — E785 Hyperlipidemia, unspecified: Secondary | ICD-10-CM

## 2019-12-24 ENCOUNTER — Other Ambulatory Visit: Payer: Self-pay | Admitting: Family

## 2019-12-24 DIAGNOSIS — K219 Gastro-esophageal reflux disease without esophagitis: Secondary | ICD-10-CM

## 2020-01-06 ENCOUNTER — Other Ambulatory Visit: Payer: Self-pay | Admitting: Family

## 2020-01-06 DIAGNOSIS — I10 Essential (primary) hypertension: Secondary | ICD-10-CM

## 2020-02-21 ENCOUNTER — Other Ambulatory Visit: Payer: Self-pay | Admitting: Family

## 2020-02-21 DIAGNOSIS — I10 Essential (primary) hypertension: Secondary | ICD-10-CM

## 2020-02-25 ENCOUNTER — Other Ambulatory Visit: Payer: Self-pay | Admitting: Family

## 2020-02-25 DIAGNOSIS — I10 Essential (primary) hypertension: Secondary | ICD-10-CM

## 2020-03-05 ENCOUNTER — Other Ambulatory Visit: Payer: Self-pay | Admitting: Family

## 2020-03-05 DIAGNOSIS — E785 Hyperlipidemia, unspecified: Secondary | ICD-10-CM

## 2020-03-06 NOTE — Telephone Encounter (Signed)
Last lipids 08/2018

## 2020-05-08 DIAGNOSIS — M9904 Segmental and somatic dysfunction of sacral region: Secondary | ICD-10-CM | POA: Diagnosis not present

## 2020-05-08 DIAGNOSIS — M9901 Segmental and somatic dysfunction of cervical region: Secondary | ICD-10-CM | POA: Diagnosis not present

## 2020-05-08 DIAGNOSIS — M9903 Segmental and somatic dysfunction of lumbar region: Secondary | ICD-10-CM | POA: Diagnosis not present

## 2020-05-08 DIAGNOSIS — M5137 Other intervertebral disc degeneration, lumbosacral region: Secondary | ICD-10-CM | POA: Diagnosis not present

## 2020-05-12 DIAGNOSIS — M9904 Segmental and somatic dysfunction of sacral region: Secondary | ICD-10-CM | POA: Diagnosis not present

## 2020-05-12 DIAGNOSIS — M5137 Other intervertebral disc degeneration, lumbosacral region: Secondary | ICD-10-CM | POA: Diagnosis not present

## 2020-05-12 DIAGNOSIS — M9903 Segmental and somatic dysfunction of lumbar region: Secondary | ICD-10-CM | POA: Diagnosis not present

## 2020-05-12 DIAGNOSIS — M9901 Segmental and somatic dysfunction of cervical region: Secondary | ICD-10-CM | POA: Diagnosis not present

## 2020-05-14 DIAGNOSIS — M9904 Segmental and somatic dysfunction of sacral region: Secondary | ICD-10-CM | POA: Diagnosis not present

## 2020-05-14 DIAGNOSIS — M5137 Other intervertebral disc degeneration, lumbosacral region: Secondary | ICD-10-CM | POA: Diagnosis not present

## 2020-05-14 DIAGNOSIS — M9901 Segmental and somatic dysfunction of cervical region: Secondary | ICD-10-CM | POA: Diagnosis not present

## 2020-05-14 DIAGNOSIS — M9903 Segmental and somatic dysfunction of lumbar region: Secondary | ICD-10-CM | POA: Diagnosis not present

## 2020-05-19 DIAGNOSIS — M9903 Segmental and somatic dysfunction of lumbar region: Secondary | ICD-10-CM | POA: Diagnosis not present

## 2020-05-19 DIAGNOSIS — M9904 Segmental and somatic dysfunction of sacral region: Secondary | ICD-10-CM | POA: Diagnosis not present

## 2020-05-19 DIAGNOSIS — M5137 Other intervertebral disc degeneration, lumbosacral region: Secondary | ICD-10-CM | POA: Diagnosis not present

## 2020-05-19 DIAGNOSIS — M9901 Segmental and somatic dysfunction of cervical region: Secondary | ICD-10-CM | POA: Diagnosis not present

## 2020-05-21 DIAGNOSIS — M9904 Segmental and somatic dysfunction of sacral region: Secondary | ICD-10-CM | POA: Diagnosis not present

## 2020-05-21 DIAGNOSIS — M9901 Segmental and somatic dysfunction of cervical region: Secondary | ICD-10-CM | POA: Diagnosis not present

## 2020-05-21 DIAGNOSIS — M9903 Segmental and somatic dysfunction of lumbar region: Secondary | ICD-10-CM | POA: Diagnosis not present

## 2020-05-21 DIAGNOSIS — M5137 Other intervertebral disc degeneration, lumbosacral region: Secondary | ICD-10-CM | POA: Diagnosis not present

## 2020-05-26 DIAGNOSIS — M5137 Other intervertebral disc degeneration, lumbosacral region: Secondary | ICD-10-CM | POA: Diagnosis not present

## 2020-05-26 DIAGNOSIS — M9901 Segmental and somatic dysfunction of cervical region: Secondary | ICD-10-CM | POA: Diagnosis not present

## 2020-05-26 DIAGNOSIS — M9903 Segmental and somatic dysfunction of lumbar region: Secondary | ICD-10-CM | POA: Diagnosis not present

## 2020-05-26 DIAGNOSIS — M9904 Segmental and somatic dysfunction of sacral region: Secondary | ICD-10-CM | POA: Diagnosis not present

## 2020-05-27 ENCOUNTER — Other Ambulatory Visit: Payer: Self-pay | Admitting: Family

## 2020-05-27 DIAGNOSIS — I10 Essential (primary) hypertension: Secondary | ICD-10-CM

## 2020-05-28 DIAGNOSIS — M9901 Segmental and somatic dysfunction of cervical region: Secondary | ICD-10-CM | POA: Diagnosis not present

## 2020-05-28 DIAGNOSIS — M9903 Segmental and somatic dysfunction of lumbar region: Secondary | ICD-10-CM | POA: Diagnosis not present

## 2020-05-28 DIAGNOSIS — M5137 Other intervertebral disc degeneration, lumbosacral region: Secondary | ICD-10-CM | POA: Diagnosis not present

## 2020-05-28 DIAGNOSIS — M9904 Segmental and somatic dysfunction of sacral region: Secondary | ICD-10-CM | POA: Diagnosis not present

## 2020-06-02 DIAGNOSIS — M9903 Segmental and somatic dysfunction of lumbar region: Secondary | ICD-10-CM | POA: Diagnosis not present

## 2020-06-02 DIAGNOSIS — M9904 Segmental and somatic dysfunction of sacral region: Secondary | ICD-10-CM | POA: Diagnosis not present

## 2020-06-02 DIAGNOSIS — M5137 Other intervertebral disc degeneration, lumbosacral region: Secondary | ICD-10-CM | POA: Diagnosis not present

## 2020-06-02 DIAGNOSIS — M9901 Segmental and somatic dysfunction of cervical region: Secondary | ICD-10-CM | POA: Diagnosis not present

## 2020-06-05 DIAGNOSIS — M5137 Other intervertebral disc degeneration, lumbosacral region: Secondary | ICD-10-CM | POA: Diagnosis not present

## 2020-06-05 DIAGNOSIS — M9901 Segmental and somatic dysfunction of cervical region: Secondary | ICD-10-CM | POA: Diagnosis not present

## 2020-06-05 DIAGNOSIS — M9903 Segmental and somatic dysfunction of lumbar region: Secondary | ICD-10-CM | POA: Diagnosis not present

## 2020-06-05 DIAGNOSIS — M9904 Segmental and somatic dysfunction of sacral region: Secondary | ICD-10-CM | POA: Diagnosis not present

## 2020-06-09 DIAGNOSIS — M9901 Segmental and somatic dysfunction of cervical region: Secondary | ICD-10-CM | POA: Diagnosis not present

## 2020-06-09 DIAGNOSIS — M5137 Other intervertebral disc degeneration, lumbosacral region: Secondary | ICD-10-CM | POA: Diagnosis not present

## 2020-06-09 DIAGNOSIS — M9904 Segmental and somatic dysfunction of sacral region: Secondary | ICD-10-CM | POA: Diagnosis not present

## 2020-06-09 DIAGNOSIS — M9903 Segmental and somatic dysfunction of lumbar region: Secondary | ICD-10-CM | POA: Diagnosis not present

## 2020-06-11 ENCOUNTER — Ambulatory Visit (INDEPENDENT_AMBULATORY_CARE_PROVIDER_SITE_OTHER): Payer: Medicare Other

## 2020-06-11 ENCOUNTER — Other Ambulatory Visit: Payer: Self-pay

## 2020-06-11 DIAGNOSIS — Z23 Encounter for immunization: Secondary | ICD-10-CM | POA: Diagnosis not present

## 2020-06-12 DIAGNOSIS — M9903 Segmental and somatic dysfunction of lumbar region: Secondary | ICD-10-CM | POA: Diagnosis not present

## 2020-06-12 DIAGNOSIS — M9901 Segmental and somatic dysfunction of cervical region: Secondary | ICD-10-CM | POA: Diagnosis not present

## 2020-06-12 DIAGNOSIS — M9904 Segmental and somatic dysfunction of sacral region: Secondary | ICD-10-CM | POA: Diagnosis not present

## 2020-06-12 DIAGNOSIS — M5137 Other intervertebral disc degeneration, lumbosacral region: Secondary | ICD-10-CM | POA: Diagnosis not present

## 2020-06-15 ENCOUNTER — Other Ambulatory Visit: Payer: Self-pay | Admitting: Family

## 2020-06-15 DIAGNOSIS — I10 Essential (primary) hypertension: Secondary | ICD-10-CM

## 2020-06-17 DIAGNOSIS — M5137 Other intervertebral disc degeneration, lumbosacral region: Secondary | ICD-10-CM | POA: Diagnosis not present

## 2020-06-17 DIAGNOSIS — M9903 Segmental and somatic dysfunction of lumbar region: Secondary | ICD-10-CM | POA: Diagnosis not present

## 2020-06-17 DIAGNOSIS — M9901 Segmental and somatic dysfunction of cervical region: Secondary | ICD-10-CM | POA: Diagnosis not present

## 2020-06-17 DIAGNOSIS — M9904 Segmental and somatic dysfunction of sacral region: Secondary | ICD-10-CM | POA: Diagnosis not present

## 2020-06-24 DIAGNOSIS — M5137 Other intervertebral disc degeneration, lumbosacral region: Secondary | ICD-10-CM | POA: Diagnosis not present

## 2020-06-24 DIAGNOSIS — M9904 Segmental and somatic dysfunction of sacral region: Secondary | ICD-10-CM | POA: Diagnosis not present

## 2020-06-24 DIAGNOSIS — M9903 Segmental and somatic dysfunction of lumbar region: Secondary | ICD-10-CM | POA: Diagnosis not present

## 2020-06-24 DIAGNOSIS — M9901 Segmental and somatic dysfunction of cervical region: Secondary | ICD-10-CM | POA: Diagnosis not present

## 2020-07-01 DIAGNOSIS — M9903 Segmental and somatic dysfunction of lumbar region: Secondary | ICD-10-CM | POA: Diagnosis not present

## 2020-07-01 DIAGNOSIS — M5137 Other intervertebral disc degeneration, lumbosacral region: Secondary | ICD-10-CM | POA: Diagnosis not present

## 2020-07-01 DIAGNOSIS — M9904 Segmental and somatic dysfunction of sacral region: Secondary | ICD-10-CM | POA: Diagnosis not present

## 2020-07-01 DIAGNOSIS — M9901 Segmental and somatic dysfunction of cervical region: Secondary | ICD-10-CM | POA: Diagnosis not present

## 2020-07-04 ENCOUNTER — Encounter: Payer: Self-pay | Admitting: Family

## 2020-07-04 ENCOUNTER — Ambulatory Visit (INDEPENDENT_AMBULATORY_CARE_PROVIDER_SITE_OTHER): Payer: Medicare Other | Admitting: Family

## 2020-07-04 ENCOUNTER — Other Ambulatory Visit: Payer: Self-pay

## 2020-07-04 ENCOUNTER — Ambulatory Visit (INDEPENDENT_AMBULATORY_CARE_PROVIDER_SITE_OTHER): Payer: Medicare Other

## 2020-07-04 ENCOUNTER — Other Ambulatory Visit: Payer: Self-pay | Admitting: Family

## 2020-07-04 VITALS — BP 156/62 | HR 62 | Temp 97.9°F | Ht 64.0 in | Wt 159.2 lb

## 2020-07-04 DIAGNOSIS — M545 Low back pain, unspecified: Secondary | ICD-10-CM | POA: Diagnosis not present

## 2020-07-04 DIAGNOSIS — M8580 Other specified disorders of bone density and structure, unspecified site: Secondary | ICD-10-CM | POA: Insufficient documentation

## 2020-07-04 DIAGNOSIS — I10 Essential (primary) hypertension: Secondary | ICD-10-CM | POA: Diagnosis not present

## 2020-07-04 DIAGNOSIS — G8929 Other chronic pain: Secondary | ICD-10-CM

## 2020-07-04 DIAGNOSIS — E663 Overweight: Secondary | ICD-10-CM

## 2020-07-04 DIAGNOSIS — M159 Polyosteoarthritis, unspecified: Secondary | ICD-10-CM

## 2020-07-04 DIAGNOSIS — E559 Vitamin D deficiency, unspecified: Secondary | ICD-10-CM

## 2020-07-04 DIAGNOSIS — K219 Gastro-esophageal reflux disease without esophagitis: Secondary | ICD-10-CM

## 2020-07-04 DIAGNOSIS — F411 Generalized anxiety disorder: Secondary | ICD-10-CM

## 2020-07-04 DIAGNOSIS — M85851 Other specified disorders of bone density and structure, right thigh: Secondary | ICD-10-CM | POA: Diagnosis not present

## 2020-07-04 DIAGNOSIS — E785 Hyperlipidemia, unspecified: Secondary | ICD-10-CM | POA: Diagnosis not present

## 2020-07-04 DIAGNOSIS — Z780 Asymptomatic menopausal state: Secondary | ICD-10-CM

## 2020-07-04 DIAGNOSIS — M85852 Other specified disorders of bone density and structure, left thigh: Secondary | ICD-10-CM | POA: Diagnosis not present

## 2020-07-04 IMAGING — CR DEXA BONE DENSITY STUDY
1 series · 1 of 1 positions shown · non-contrast
Comparison: None.

CLINICAL DATA: 79-year-old postmenopausal female.

EXAM:
DUAL X-RAY ABSORPTIOMETRY (DXA) FOR BONE MINERAL DENSITY
TECHNIQUE: Bone mineral density measurements are performed of the spine, hip,
and forearm, as appropriate, per International Society of Clinical
Densitometry recommendations. The pertinent regions of interest are
reported below. Non-contributory values are not reported. Images are
obtained for bone mineral density measurement and are not obtained
for diagnostic purposes.

[dxa images]
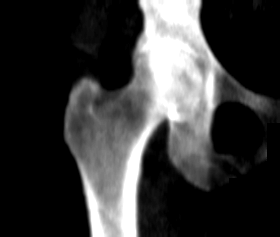

[1 of 1 positions shown; findings below may reference images not displayed]

FINDINGS: AP LUMBAR SPINE

Bone Mineral Density (BMD):  1.083 g/cm2

Young Adult T-Score:  -0.9

Z-Score:

LEFT FEMUR NECK

Bone Mineral Density (BMD):  0.838 g/cm2

Young Adult T-Score: -1.4

Z-Score:

RIGHT FEMUR NECK

Bone Mineral Density (BMD):  0.798 g/cm2

Young Adult T-Score: -1.7

Z-Score:

Unit: This study was performed at [HOSPITAL]
on a [employer] Prodig[employer] Advance.

Scan quality: Good.

ASSESSMENT: Patient's diagnostic category is LOW BONE
MASS/OSTEOPENIA by WHO Criteria.

FRACTURE RISK: INCREASED

FRAX: Based on the World Health Organization FRAX model, the 10 year
probability of a major osteoporotic fracture is 14.5%. The 10 year
probability of a hip fracture is 4%.
RECOMMENDATIONS

1. All patients should optimize calcium and vitamin D intake.

2. Consider FDA-approved medical therapies in postmenopausal women
and men aged 50 years and older, based on the following:

- A hip or vertebral (clinical or morphometric) fracture

- T-score less than or equal to -2.5 at the femoral neck or spine
after appropriate evaluation to exclude secondary causes

- Low bone mass (T-score between -1.0 and -2.5 at the femoral neck
or spine) and a 10-year probability of a hip fracture greater than
or equal to 3% or a 10-year probability of a major
osteoporosis-related fracture greater than or equal to 20% based on
the [place]-adapted WHO algorithm

- Clinician judgment and/or patient preferences may indicate
treatment for people with 10-year fracture probabilities above or
below these levels

3. Patients with diagnosis of osteoporosis or at high risk for
fracture should have regular bone mineral density tests. For
patients eligible for Medicare, routine testing is allowed once
every 2 years. The testing frequency can be increased to one year
for patients who have rapidly progressing disease, those who are
receiving or discontinuing medical therapy to restore bone mass, or
have additional risk factors.

## 2020-07-04 MED ORDER — BENAZEPRIL-HYDROCHLOROTHIAZIDE 20-25 MG PO TABS: 1.0000 | ORAL_TABLET | Freq: Every day | ORAL | 2 refills | Status: DC

## 2020-07-04 MED ORDER — AMLODIPINE BESYLATE 5 MG PO TABS: 5.0000 mg | ORAL_TABLET | Freq: Every day | ORAL | 2 refills | Status: DC

## 2020-07-04 MED ORDER — PREDNISONE 10 MG (21) PO TBPK: ORAL_TABLET | ORAL | 0 refills | Status: DC

## 2020-07-04 MED ORDER — LOVASTATIN 40 MG PO TABS: 40.0000 mg | ORAL_TABLET | Freq: Every day | ORAL | 2 refills | Status: DC

## 2020-07-04 MED ORDER — METHYLPREDNISOLONE ACETATE 80 MG/ML IJ SUSP
80.0000 mg | Freq: Once | INTRAMUSCULAR | Status: AC
  Administered 2020-07-04: 80 mg via INTRAMUSCULAR

## 2020-07-04 MED ORDER — OMEPRAZOLE 20 MG PO CPDR: 20.0000 mg | DELAYED_RELEASE_CAPSULE | Freq: Every day | ORAL | 3 refills | Status: DC

## 2020-07-04 NOTE — Patient Instructions (Signed)

## 2020-07-04 NOTE — Progress Notes (Signed)
 Subjective:    Patient ID: Belinda Scott, female    DOB: 04/01/1941, 79 y.o.   MRN: [id number]  Chief Complaint  Patient presents with  . Hypertension   Pt presents to the office today for chronic follow up and pain management. She is going to the chiropractor once a week and started using some THC products and states her pain is much improved. She no longer is taking her Norco.   Hypertension This is a chronic problem. The current episode started more than 1 year ago. The problem has been resolved since onset. The problem is uncontrolled. Associated symptoms include anxiety. Pertinent negatives include no malaise/fatigue, peripheral edema or shortness of breath. Risk factors for coronary artery disease include dyslipidemia, obesity and sedentary lifestyle. The current treatment provides moderate improvement.  Arthritis Presents for follow-up visit. She complains of pain and stiffness. The symptoms have been improving. Affected locations include the left knee, right knee and neck. Her pain is at a severity of 10/10.  Hyperlipidemia This is a chronic problem. The current episode started more than 1 year ago. The problem is controlled. Recent lipid tests were reviewed and are normal. Pertinent negatives include no shortness of breath. Current antihyperlipidemic treatment includes statins. The current treatment provides moderate improvement of lipids. Risk factors for coronary artery disease include dyslipidemia, hypertension, a sedentary lifestyle and post-menopausal.  Anxiety Presents for follow-up visit. Symptoms include excessive worry, irritability, nervous/anxious behavior and restlessness. Patient reports no shortness of breath. Symptoms occur most days. The severity of symptoms is moderate. The quality of sleep is good.    Gastroesophageal Reflux She complains of belching and heartburn. This is a chronic problem. The current episode started more than 1 year ago. The problem occurs  occasionally. Risk factors include obesity. She has tried a PPI for the symptoms.     Review of Systems  Constitutional: Positive for irritability. Negative for malaise/fatigue.  Respiratory: Negative for shortness of breath.   Gastrointestinal: Positive for heartburn.  Musculoskeletal: Positive for arthritis and stiffness.  Psychiatric/Behavioral: The patient is nervous/anxious.   All other systems reviewed and are negative.      Objective:   Physical Exam Vitals reviewed.  Constitutional:      General: She is not in acute distress.    Appearance: She is well-developed.  HENT:     Head: Normocephalic and atraumatic.     Right Ear: Tympanic membrane normal.     Left Ear: Tympanic membrane normal.  Eyes:     Pupils: Pupils are equal, round, and reactive to light.  Neck:     Thyroid: No thyromegaly.  Cardiovascular:     Rate and Rhythm: Normal rate and regular rhythm.     Heart sounds: Normal heart sounds. No murmur heard.   Pulmonary:     Effort: Pulmonary effort is normal. No respiratory distress.     Breath sounds: Normal breath sounds. No wheezing.  Abdominal:     General: Bowel sounds are normal. There is no distension.     Palpations: Abdomen is soft.     Tenderness: There is no abdominal tenderness.  Musculoskeletal:        General: Tenderness present.     Cervical back: Normal range of motion and neck supple.     Comments: Pain in lumbar with flexion and extension  Skin:    General: Skin is warm and dry.  Neurological:     Mental Status: She is alert and oriented to person, place, and time.       Cranial Nerves: No cranial nerve deficit.     Deep Tendon Reflexes: Reflexes are normal and symmetric.  Psychiatric:        Behavior: Behavior normal.        Thought Content: Thought content normal.        Judgment: Judgment normal.      BP (!) 156/62   Pulse 62   Temp 97.9 F (36.6 C) (Temporal)   Ht 5' 4" (1.626 m)   Wt 159 lb 3.2 oz (72.2 kg)   SpO2 97%    BMI 27.33 kg/m       Assessment & Plan:  Belinda Scott comes in today with chief complaint of Hypertension   Diagnosis and orders addressed:  1. Essential hypertension - amLODipine (NORVASC) 5 MG tablet; Take 1 tablet (5 mg total) by mouth daily.  Dispense: 90 tablet; Refill: 2 - benazepril-hydrochlorthiazide (LOTENSIN HCT) 20-25 MG tablet; Take 1 tablet by mouth daily.  Dispense: 90 tablet; Refill: 2 - CMP14+EGFR - CBC with Differential/Platelet  2. Hyperlipidemia, unspecified hyperlipidemia type - lovastatin (MEVACOR) 40 MG tablet; Take 1 tablet (40 mg total) by mouth at bedtime.  Dispense: 90 tablet; Refill: 2 - CMP14+EGFR - CBC with Differential/Platelet - Lipid panel  3. Gastroesophageal reflux disease - omeprazole (PRILOSEC) 20 MG capsule; Take 1 capsule (20 mg total) by mouth daily.  Dispense: 90 capsule; Refill: 3 - CMP14+EGFR - CBC with Differential/Platelet  4. Osteoarthritis of multiple joints, unspecified osteoarthritis type -Pt will hold off on taking steroid dose pack at this time, and will start in next few weeks if pain is increased - CMP14+EGFR - CBC with Differential/Platelet - methylPREDNISolone acetate (DEPO-MEDROL) injection 80 mg - predniSONE (STERAPRED UNI-PAK 21 TAB) 10 MG (21) TBPK tablet; Use as directed  Dispense: 21 tablet; Refill: 0  5. Chronic bilateral low back pain, unspecified whether sciatica present - CMP14+EGFR - CBC with Differential/Platelet  6. GAD (generalized anxiety disorder) - CMP14+EGFR - CBC with Differential/Platelet  7. Overweight (BMI 25.0-29.9) - CMP14+EGFR - CBC with Differential/Platelet  8. Vitamin D deficiency - CMP14+EGFR - CBC with Differential/Platelet  9. Post-menopause - CMP14+EGFR - CBC with Differential/Platelet - DG [facility] DEXA   Labs pending Health Maintenance reviewed Diet and exercise encouraged  Follow up plan: 6 months    Evelina Dun, FNP

## 2020-07-05 LAB — LIPID PANEL
Chol/HDL Ratio: 3 ratio (ref 0.0–4.4)
Cholesterol, Total: 167 mg/dL (ref 100–199)
HDL: 55 mg/dL (ref >39)
LDL Chol Calc (NIH): 84 mg/dL (ref 0–99)
Triglycerides: 164 mg/dL — ABNORMAL HIGH (ref 0–149)
VLDL Cholesterol Cal: 28 mg/dL (ref 5–40)

## 2020-07-05 LAB — CBC WITH DIFFERENTIAL/PLATELET
Basophils Absolute: 0.1 10*3/uL (ref 0.0–0.2)
Basos: 1 %
EOS (ABSOLUTE): 0.1 10*3/uL (ref 0.0–0.4)
Eos: 1 %
Hematocrit: 45.6 % (ref 34.0–46.6)
Hemoglobin: 15.4 g/dL (ref 11.1–15.9)
Immature Grans (Abs): 0 10*3/uL (ref 0.0–0.1)
Immature Granulocytes: 0 %
Lymphocytes Absolute: 4 10*3/uL — ABNORMAL HIGH (ref 0.7–3.1)
Lymphs: 42 %
MCH: 30.2 pg (ref 26.6–33.0)
MCHC: 33.8 g/dL (ref 31.5–35.7)
MCV: 89 fL (ref 79–97)
Monocytes Absolute: 0.7 10*3/uL (ref 0.1–0.9)
Monocytes: 7 %
Neutrophils Absolute: 4.6 10*3/uL (ref 1.4–7.0)
Neutrophils: 49 %
Platelets: 209 10*3/uL (ref 150–450)
RBC: 5.1 x10E6/uL (ref 3.77–5.28)
RDW: 12.5 % (ref 11.7–15.4)
WBC: 9.4 10*3/uL (ref 3.4–10.8)

## 2020-07-05 LAB — CMP14+EGFR
ALT: 23 IU/L (ref 0–32)
AST: 27 IU/L (ref 0–40)
Albumin/Globulin Ratio: 1.3 (ref 1.2–2.2)
Albumin: 4.3 g/dL (ref 3.6–4.6)
Alkaline Phosphatase: 76 IU/L (ref 44–121)
BUN/Creatinine Ratio: 16 (ref 12–28)
BUN: 13 mg/dL (ref 8–27)
Bilirubin Total: 0.6 mg/dL (ref 0.0–1.2)
CO2: 29 mmol/L (ref 20–29)
Calcium: 9.6 mg/dL (ref 8.7–10.3)
Chloride: 101 mmol/L (ref 96–106)
Creatinine, Ser: 0.83 mg/dL (ref 0.57–1.00)
GFR calc Af Amer: 76 mL/min/{1.73_m2} (ref >59)
GFR calc non Af Amer: 66 mL/min/{1.73_m2} (ref >59)
Globulin, Total: 3.2 g/dL (ref 1.5–4.5)
Glucose: 96 mg/dL (ref 65–99)
Potassium: 3.6 mmol/L (ref 3.5–5.2)
Sodium: 143 mmol/L (ref 134–144)
Total Protein: 7.5 g/dL (ref 6.0–8.5)

## 2020-07-08 DIAGNOSIS — M9901 Segmental and somatic dysfunction of cervical region: Secondary | ICD-10-CM | POA: Diagnosis not present

## 2020-07-08 DIAGNOSIS — M9904 Segmental and somatic dysfunction of sacral region: Secondary | ICD-10-CM | POA: Diagnosis not present

## 2020-07-08 DIAGNOSIS — M5137 Other intervertebral disc degeneration, lumbosacral region: Secondary | ICD-10-CM | POA: Diagnosis not present

## 2020-07-08 DIAGNOSIS — M9903 Segmental and somatic dysfunction of lumbar region: Secondary | ICD-10-CM | POA: Diagnosis not present

## 2020-07-09 NOTE — Progress Notes (Signed)
Patient aware.

## 2020-07-15 DIAGNOSIS — M5137 Other intervertebral disc degeneration, lumbosacral region: Secondary | ICD-10-CM | POA: Diagnosis not present

## 2020-07-15 DIAGNOSIS — M9904 Segmental and somatic dysfunction of sacral region: Secondary | ICD-10-CM | POA: Diagnosis not present

## 2020-07-15 DIAGNOSIS — M9903 Segmental and somatic dysfunction of lumbar region: Secondary | ICD-10-CM | POA: Diagnosis not present

## 2020-07-15 DIAGNOSIS — M9901 Segmental and somatic dysfunction of cervical region: Secondary | ICD-10-CM | POA: Diagnosis not present

## 2020-07-24 DIAGNOSIS — M9904 Segmental and somatic dysfunction of sacral region: Secondary | ICD-10-CM | POA: Diagnosis not present

## 2020-07-24 DIAGNOSIS — M9903 Segmental and somatic dysfunction of lumbar region: Secondary | ICD-10-CM | POA: Diagnosis not present

## 2020-07-24 DIAGNOSIS — M9901 Segmental and somatic dysfunction of cervical region: Secondary | ICD-10-CM | POA: Diagnosis not present

## 2020-07-24 DIAGNOSIS — M5137 Other intervertebral disc degeneration, lumbosacral region: Secondary | ICD-10-CM | POA: Diagnosis not present

## 2020-07-29 DIAGNOSIS — M9903 Segmental and somatic dysfunction of lumbar region: Secondary | ICD-10-CM | POA: Diagnosis not present

## 2020-07-29 DIAGNOSIS — M9904 Segmental and somatic dysfunction of sacral region: Secondary | ICD-10-CM | POA: Diagnosis not present

## 2020-07-29 DIAGNOSIS — M5137 Other intervertebral disc degeneration, lumbosacral region: Secondary | ICD-10-CM | POA: Diagnosis not present

## 2020-07-29 DIAGNOSIS — M9901 Segmental and somatic dysfunction of cervical region: Secondary | ICD-10-CM | POA: Diagnosis not present

## 2020-08-05 DIAGNOSIS — M5137 Other intervertebral disc degeneration, lumbosacral region: Secondary | ICD-10-CM | POA: Diagnosis not present

## 2020-08-05 DIAGNOSIS — M9903 Segmental and somatic dysfunction of lumbar region: Secondary | ICD-10-CM | POA: Diagnosis not present

## 2020-08-05 DIAGNOSIS — M9901 Segmental and somatic dysfunction of cervical region: Secondary | ICD-10-CM | POA: Diagnosis not present

## 2020-08-05 DIAGNOSIS — M9904 Segmental and somatic dysfunction of sacral region: Secondary | ICD-10-CM | POA: Diagnosis not present

## 2020-08-12 DIAGNOSIS — M9903 Segmental and somatic dysfunction of lumbar region: Secondary | ICD-10-CM | POA: Diagnosis not present

## 2020-08-12 DIAGNOSIS — M9901 Segmental and somatic dysfunction of cervical region: Secondary | ICD-10-CM | POA: Diagnosis not present

## 2020-08-12 DIAGNOSIS — M5137 Other intervertebral disc degeneration, lumbosacral region: Secondary | ICD-10-CM | POA: Diagnosis not present

## 2020-08-12 DIAGNOSIS — M9904 Segmental and somatic dysfunction of sacral region: Secondary | ICD-10-CM | POA: Diagnosis not present

## 2020-08-19 DIAGNOSIS — M5137 Other intervertebral disc degeneration, lumbosacral region: Secondary | ICD-10-CM | POA: Diagnosis not present

## 2020-08-19 DIAGNOSIS — M9904 Segmental and somatic dysfunction of sacral region: Secondary | ICD-10-CM | POA: Diagnosis not present

## 2020-08-19 DIAGNOSIS — M9901 Segmental and somatic dysfunction of cervical region: Secondary | ICD-10-CM | POA: Diagnosis not present

## 2020-08-19 DIAGNOSIS — M9903 Segmental and somatic dysfunction of lumbar region: Secondary | ICD-10-CM | POA: Diagnosis not present

## 2020-08-28 DIAGNOSIS — M9903 Segmental and somatic dysfunction of lumbar region: Secondary | ICD-10-CM | POA: Diagnosis not present

## 2020-08-28 DIAGNOSIS — M9904 Segmental and somatic dysfunction of sacral region: Secondary | ICD-10-CM | POA: Diagnosis not present

## 2020-08-28 DIAGNOSIS — M5137 Other intervertebral disc degeneration, lumbosacral region: Secondary | ICD-10-CM | POA: Diagnosis not present

## 2020-08-28 DIAGNOSIS — M9901 Segmental and somatic dysfunction of cervical region: Secondary | ICD-10-CM | POA: Diagnosis not present

## 2020-09-03 DIAGNOSIS — M9904 Segmental and somatic dysfunction of sacral region: Secondary | ICD-10-CM | POA: Diagnosis not present

## 2020-09-03 DIAGNOSIS — M9903 Segmental and somatic dysfunction of lumbar region: Secondary | ICD-10-CM | POA: Diagnosis not present

## 2020-09-03 DIAGNOSIS — M9901 Segmental and somatic dysfunction of cervical region: Secondary | ICD-10-CM | POA: Diagnosis not present

## 2020-09-03 DIAGNOSIS — M5137 Other intervertebral disc degeneration, lumbosacral region: Secondary | ICD-10-CM | POA: Diagnosis not present

## 2020-09-16 DIAGNOSIS — M9904 Segmental and somatic dysfunction of sacral region: Secondary | ICD-10-CM | POA: Diagnosis not present

## 2020-09-16 DIAGNOSIS — M9903 Segmental and somatic dysfunction of lumbar region: Secondary | ICD-10-CM | POA: Diagnosis not present

## 2020-09-16 DIAGNOSIS — M9901 Segmental and somatic dysfunction of cervical region: Secondary | ICD-10-CM | POA: Diagnosis not present

## 2020-09-16 DIAGNOSIS — M5137 Other intervertebral disc degeneration, lumbosacral region: Secondary | ICD-10-CM | POA: Diagnosis not present

## 2020-09-24 DIAGNOSIS — M9903 Segmental and somatic dysfunction of lumbar region: Secondary | ICD-10-CM | POA: Diagnosis not present

## 2020-09-24 DIAGNOSIS — M5137 Other intervertebral disc degeneration, lumbosacral region: Secondary | ICD-10-CM | POA: Diagnosis not present

## 2020-09-24 DIAGNOSIS — M9901 Segmental and somatic dysfunction of cervical region: Secondary | ICD-10-CM | POA: Diagnosis not present

## 2020-09-24 DIAGNOSIS — M9904 Segmental and somatic dysfunction of sacral region: Secondary | ICD-10-CM | POA: Diagnosis not present

## 2020-09-30 ENCOUNTER — Other Ambulatory Visit: Payer: Self-pay | Admitting: Family

## 2020-09-30 DIAGNOSIS — K219 Gastro-esophageal reflux disease without esophagitis: Secondary | ICD-10-CM

## 2020-10-02 DIAGNOSIS — M9901 Segmental and somatic dysfunction of cervical region: Secondary | ICD-10-CM | POA: Diagnosis not present

## 2020-10-02 DIAGNOSIS — M5137 Other intervertebral disc degeneration, lumbosacral region: Secondary | ICD-10-CM | POA: Diagnosis not present

## 2020-10-02 DIAGNOSIS — M9904 Segmental and somatic dysfunction of sacral region: Secondary | ICD-10-CM | POA: Diagnosis not present

## 2020-10-02 DIAGNOSIS — M9903 Segmental and somatic dysfunction of lumbar region: Secondary | ICD-10-CM | POA: Diagnosis not present

## 2020-10-15 DIAGNOSIS — M9904 Segmental and somatic dysfunction of sacral region: Secondary | ICD-10-CM | POA: Diagnosis not present

## 2020-10-15 DIAGNOSIS — M5137 Other intervertebral disc degeneration, lumbosacral region: Secondary | ICD-10-CM | POA: Diagnosis not present

## 2020-10-15 DIAGNOSIS — M9903 Segmental and somatic dysfunction of lumbar region: Secondary | ICD-10-CM | POA: Diagnosis not present

## 2020-10-15 DIAGNOSIS — M9901 Segmental and somatic dysfunction of cervical region: Secondary | ICD-10-CM | POA: Diagnosis not present

## 2020-10-30 DIAGNOSIS — M9904 Segmental and somatic dysfunction of sacral region: Secondary | ICD-10-CM | POA: Diagnosis not present

## 2020-10-30 DIAGNOSIS — M9903 Segmental and somatic dysfunction of lumbar region: Secondary | ICD-10-CM | POA: Diagnosis not present

## 2020-10-30 DIAGNOSIS — M9901 Segmental and somatic dysfunction of cervical region: Secondary | ICD-10-CM | POA: Diagnosis not present

## 2020-10-30 DIAGNOSIS — M5137 Other intervertebral disc degeneration, lumbosacral region: Secondary | ICD-10-CM | POA: Diagnosis not present

## 2020-11-13 DIAGNOSIS — M9903 Segmental and somatic dysfunction of lumbar region: Secondary | ICD-10-CM | POA: Diagnosis not present

## 2020-11-13 DIAGNOSIS — M5137 Other intervertebral disc degeneration, lumbosacral region: Secondary | ICD-10-CM | POA: Diagnosis not present

## 2020-11-13 DIAGNOSIS — M9904 Segmental and somatic dysfunction of sacral region: Secondary | ICD-10-CM | POA: Diagnosis not present

## 2020-11-13 DIAGNOSIS — M9901 Segmental and somatic dysfunction of cervical region: Secondary | ICD-10-CM | POA: Diagnosis not present

## 2020-12-01 DIAGNOSIS — M5137 Other intervertebral disc degeneration, lumbosacral region: Secondary | ICD-10-CM | POA: Diagnosis not present

## 2020-12-01 DIAGNOSIS — M9904 Segmental and somatic dysfunction of sacral region: Secondary | ICD-10-CM | POA: Diagnosis not present

## 2020-12-01 DIAGNOSIS — M9901 Segmental and somatic dysfunction of cervical region: Secondary | ICD-10-CM | POA: Diagnosis not present

## 2020-12-01 DIAGNOSIS — M9903 Segmental and somatic dysfunction of lumbar region: Secondary | ICD-10-CM | POA: Diagnosis not present

## 2020-12-18 DIAGNOSIS — M9901 Segmental and somatic dysfunction of cervical region: Secondary | ICD-10-CM | POA: Diagnosis not present

## 2020-12-18 DIAGNOSIS — M9904 Segmental and somatic dysfunction of sacral region: Secondary | ICD-10-CM | POA: Diagnosis not present

## 2020-12-18 DIAGNOSIS — M9903 Segmental and somatic dysfunction of lumbar region: Secondary | ICD-10-CM | POA: Diagnosis not present

## 2020-12-18 DIAGNOSIS — M5137 Other intervertebral disc degeneration, lumbosacral region: Secondary | ICD-10-CM | POA: Diagnosis not present

## 2021-01-01 DIAGNOSIS — M9901 Segmental and somatic dysfunction of cervical region: Secondary | ICD-10-CM | POA: Diagnosis not present

## 2021-01-01 DIAGNOSIS — M9903 Segmental and somatic dysfunction of lumbar region: Secondary | ICD-10-CM | POA: Diagnosis not present

## 2021-01-01 DIAGNOSIS — M9904 Segmental and somatic dysfunction of sacral region: Secondary | ICD-10-CM | POA: Diagnosis not present

## 2021-01-01 DIAGNOSIS — M5137 Other intervertebral disc degeneration, lumbosacral region: Secondary | ICD-10-CM | POA: Diagnosis not present

## 2021-01-15 DIAGNOSIS — M9901 Segmental and somatic dysfunction of cervical region: Secondary | ICD-10-CM | POA: Diagnosis not present

## 2021-01-15 DIAGNOSIS — M9903 Segmental and somatic dysfunction of lumbar region: Secondary | ICD-10-CM | POA: Diagnosis not present

## 2021-01-15 DIAGNOSIS — M9904 Segmental and somatic dysfunction of sacral region: Secondary | ICD-10-CM | POA: Diagnosis not present

## 2021-01-15 DIAGNOSIS — M5137 Other intervertebral disc degeneration, lumbosacral region: Secondary | ICD-10-CM | POA: Diagnosis not present

## 2021-01-29 DIAGNOSIS — M9904 Segmental and somatic dysfunction of sacral region: Secondary | ICD-10-CM | POA: Diagnosis not present

## 2021-01-29 DIAGNOSIS — M9903 Segmental and somatic dysfunction of lumbar region: Secondary | ICD-10-CM | POA: Diagnosis not present

## 2021-01-29 DIAGNOSIS — M9901 Segmental and somatic dysfunction of cervical region: Secondary | ICD-10-CM | POA: Diagnosis not present

## 2021-01-29 DIAGNOSIS — M5137 Other intervertebral disc degeneration, lumbosacral region: Secondary | ICD-10-CM | POA: Diagnosis not present

## 2021-02-19 DIAGNOSIS — M9901 Segmental and somatic dysfunction of cervical region: Secondary | ICD-10-CM | POA: Diagnosis not present

## 2021-02-19 DIAGNOSIS — M9903 Segmental and somatic dysfunction of lumbar region: Secondary | ICD-10-CM | POA: Diagnosis not present

## 2021-02-19 DIAGNOSIS — M5137 Other intervertebral disc degeneration, lumbosacral region: Secondary | ICD-10-CM | POA: Diagnosis not present

## 2021-02-19 DIAGNOSIS — M9904 Segmental and somatic dysfunction of sacral region: Secondary | ICD-10-CM | POA: Diagnosis not present

## 2021-03-05 DIAGNOSIS — M9901 Segmental and somatic dysfunction of cervical region: Secondary | ICD-10-CM | POA: Diagnosis not present

## 2021-03-05 DIAGNOSIS — M5137 Other intervertebral disc degeneration, lumbosacral region: Secondary | ICD-10-CM | POA: Diagnosis not present

## 2021-03-05 DIAGNOSIS — M9904 Segmental and somatic dysfunction of sacral region: Secondary | ICD-10-CM | POA: Diagnosis not present

## 2021-03-05 DIAGNOSIS — M9903 Segmental and somatic dysfunction of lumbar region: Secondary | ICD-10-CM | POA: Diagnosis not present

## 2021-03-18 DIAGNOSIS — M5137 Other intervertebral disc degeneration, lumbosacral region: Secondary | ICD-10-CM | POA: Diagnosis not present

## 2021-03-18 DIAGNOSIS — M9901 Segmental and somatic dysfunction of cervical region: Secondary | ICD-10-CM | POA: Diagnosis not present

## 2021-03-18 DIAGNOSIS — M9903 Segmental and somatic dysfunction of lumbar region: Secondary | ICD-10-CM | POA: Diagnosis not present

## 2021-03-18 DIAGNOSIS — M9904 Segmental and somatic dysfunction of sacral region: Secondary | ICD-10-CM | POA: Diagnosis not present

## 2021-04-02 DIAGNOSIS — H40033 Anatomical narrow angle, bilateral: Secondary | ICD-10-CM | POA: Diagnosis not present

## 2021-04-02 DIAGNOSIS — H2513 Age-related nuclear cataract, bilateral: Secondary | ICD-10-CM | POA: Diagnosis not present

## 2021-04-02 DIAGNOSIS — M5137 Other intervertebral disc degeneration, lumbosacral region: Secondary | ICD-10-CM | POA: Diagnosis not present

## 2021-04-02 DIAGNOSIS — M9903 Segmental and somatic dysfunction of lumbar region: Secondary | ICD-10-CM | POA: Diagnosis not present

## 2021-04-02 DIAGNOSIS — M9904 Segmental and somatic dysfunction of sacral region: Secondary | ICD-10-CM | POA: Diagnosis not present

## 2021-04-02 DIAGNOSIS — M9901 Segmental and somatic dysfunction of cervical region: Secondary | ICD-10-CM | POA: Diagnosis not present

## 2021-04-14 ENCOUNTER — Other Ambulatory Visit: Payer: Self-pay

## 2021-04-14 ENCOUNTER — Ambulatory Visit (INDEPENDENT_AMBULATORY_CARE_PROVIDER_SITE_OTHER): Payer: Medicare Other | Admitting: Family

## 2021-04-14 ENCOUNTER — Encounter: Payer: Self-pay | Admitting: Family

## 2021-04-14 VITALS — BP 155/75 | HR 72 | Temp 98.0°F | Ht 64.0 in | Wt 158.8 lb

## 2021-04-14 DIAGNOSIS — M159 Polyosteoarthritis, unspecified: Secondary | ICD-10-CM | POA: Diagnosis not present

## 2021-04-14 DIAGNOSIS — I10 Essential (primary) hypertension: Secondary | ICD-10-CM | POA: Diagnosis not present

## 2021-04-14 DIAGNOSIS — E559 Vitamin D deficiency, unspecified: Secondary | ICD-10-CM

## 2021-04-14 DIAGNOSIS — F411 Generalized anxiety disorder: Secondary | ICD-10-CM | POA: Diagnosis not present

## 2021-04-14 DIAGNOSIS — M545 Low back pain, unspecified: Secondary | ICD-10-CM | POA: Diagnosis not present

## 2021-04-14 DIAGNOSIS — G8929 Other chronic pain: Secondary | ICD-10-CM

## 2021-04-14 DIAGNOSIS — E785 Hyperlipidemia, unspecified: Secondary | ICD-10-CM

## 2021-04-14 DIAGNOSIS — E663 Overweight: Secondary | ICD-10-CM

## 2021-04-14 MED ORDER — BENAZEPRIL-HYDROCHLOROTHIAZIDE 20-25 MG PO TABS: 1.0000 | ORAL_TABLET | Freq: Every day | ORAL | 2 refills | Status: DC

## 2021-04-14 MED ORDER — AMLODIPINE BESYLATE 5 MG PO TABS: 5.0000 mg | ORAL_TABLET | Freq: Every day | ORAL | 2 refills | Status: DC

## 2021-04-14 MED ORDER — LOVASTATIN 40 MG PO TABS: 40.0000 mg | ORAL_TABLET | Freq: Every day | ORAL | 2 refills | Status: DC

## 2021-04-14 MED ORDER — METHYLPREDNISOLONE ACETATE 80 MG/ML IJ SUSP
80.0000 mg | Freq: Once | INTRAMUSCULAR | Status: AC
  Administered 2021-04-14: 80 mg via INTRAMUSCULAR

## 2021-04-14 MED ORDER — KETOROLAC TROMETHAMINE 60 MG/2ML IM SOLN
60.0000 mg | Freq: Once | INTRAMUSCULAR | Status: AC
  Administered 2021-04-14: 60 mg via INTRAMUSCULAR

## 2021-04-14 NOTE — Patient Instructions (Signed)

## 2021-04-14 NOTE — Progress Notes (Signed)
Subjective:    Patient ID: Belinda Scott, female    DOB: 1941-04-20, 80 y.o.   MRN: [id number]  Chief Complaint  Patient presents with  . Hypertension  . Hyperlipidemia    6 month follow up     Pt presents to the office today for chronic follow up. She is going to the chiropractor every other week and started using some THC products and states her pain is much improved. She no longer is taking her Norco.   Hypertension This is a chronic problem. The current episode started more than 1 year ago. The problem has been waxing and waning since onset. The problem is uncontrolled. Associated symptoms include anxiety. Pertinent negatives include no malaise/fatigue, peripheral edema or shortness of breath. Risk factors for coronary artery disease include dyslipidemia, obesity and sedentary lifestyle. The current treatment provides moderate improvement.  Arthritis Presents for follow-up visit. She complains of pain and stiffness. The symptoms have been stable. Affected locations include the left MCP, right MCP, left knee, right knee and neck (back). Her pain is at a severity of 10/10.  Hyperlipidemia This is a chronic problem. The current episode started more than 1 year ago. Pertinent negatives include no shortness of breath. The current treatment provides mild improvement of lipids. Risk factors for coronary artery disease include dyslipidemia, hypertension, a sedentary lifestyle and post-menopausal.  Anxiety Presents for follow-up visit. Symptoms include excessive worry, irritability and nervous/anxious behavior. Patient reports no shortness of breath. Symptoms occur most days. The severity of symptoms is moderate.    Back Pain This is a chronic problem. The current episode started more than 1 year ago. The problem occurs intermittently. The problem has been waxing and waning since onset. The pain is present in the lumbar spine. The pain is at a severity of 10/10. The pain is moderate.      Review of Systems  Constitutional:  Positive for irritability. Negative for malaise/fatigue.  Respiratory:  Negative for shortness of breath.   Musculoskeletal:  Positive for arthritis, back pain and stiffness.  Psychiatric/Behavioral:  The patient is nervous/anxious.   All other systems reviewed and are negative.     Objective:   Physical Exam Vitals reviewed.  Constitutional:      General: She is not in acute distress.    Appearance: She is well-developed. She is obese.  HENT:     Head: Normocephalic and atraumatic.     Right Ear: Tympanic membrane normal.     Left Ear: Tympanic membrane normal.  Eyes:     Pupils: Pupils are equal, round, and reactive to light.  Neck:     Thyroid: No thyromegaly.  Cardiovascular:     Rate and Rhythm: Normal rate and regular rhythm.     Heart sounds: Normal heart sounds. No murmur heard. Pulmonary:     Effort: Pulmonary effort is normal. No respiratory distress.     Breath sounds: Normal breath sounds. No wheezing.  Abdominal:     General: Bowel sounds are normal. There is no distension.     Palpations: Abdomen is soft.     Tenderness: There is no abdominal tenderness.  Musculoskeletal:        General: No tenderness. Normal range of motion.     Cervical back: Normal range of motion and neck supple.  Skin:    General: Skin is warm and dry.  Neurological:     Mental Status: She is alert and oriented to person, place, and time.     Cranial  Nerves: No cranial nerve deficit.     Deep Tendon Reflexes: Reflexes are normal and symmetric.  Psychiatric:        Behavior: Behavior normal.        Thought Content: Thought content normal.        Judgment: Judgment normal.    BP (!) 155/75 Comment: patient reports at home  Pulse 72   Temp 98 F (36.7 C) (Temporal)   Ht 5' 4" (1.626 m)   Wt 158 lb 12.8 oz (72 kg)   SpO2 97%   BMI 27.26 kg/m     Assessment & Plan:  Belinda Scott comes in today with chief complaint of Hypertension and  Hyperlipidemia (6 month follow up )   Diagnosis and orders addressed:  1. Essential hypertension - CMP14+EGFR - CBC with Differential/Platelet - amLODipine (NORVASC) 5 MG tablet; Take 1 tablet (5 mg total) by mouth daily.  Dispense: 90 tablet; Refill: 2 - benazepril-hydrochlorthiazide (LOTENSIN HCT) 20-25 MG tablet; Take 1 tablet by mouth daily.  Dispense: 90 tablet; Refill: 2  2. Osteoarthritis of multiple joints, unspecified osteoarthritis type - CMP14+EGFR - CBC with Differential/Platelet - Ambulatory referral to Orthopedic Surgery - methylPREDNISolone acetate (DEPO-MEDROL) injection 80 mg - ketorolac (TORADOL) injection 60 mg  3. GAD (generalized anxiety disorder) - CMP14+EGFR - CBC with Differential/Platelet  4. Hyperlipidemia, unspecified hyperlipidemia type - CMP14+EGFR - CBC with Differential/Platelet - Lipid panel - lovastatin (MEVACOR) 40 MG tablet; Take 1 tablet (40 mg total) by mouth at bedtime.  Dispense: 90 tablet; Refill: 2  5. Overweight (BMI 25.0-29.9) - CMP14+EGFR - CBC with Differential/Platelet  6. Vitamin D deficiency - CMP14+EGFR - CBC with Differential/Platelet - VITAMIN D 25 Hydroxy (Vit-D Deficiency, Fractures)  7. Chronic bilateral low back pain, unspecified whether sciatica present - CMP14+EGFR - CBC with Differential/Platelet - Ambulatory referral to Orthopedic Surgery - methylPREDNISolone acetate (DEPO-MEDROL) injection 80 mg - ketorolac (TORADOL) injection 60 mg   Labs pending Health Maintenance reviewed Diet and exercise encouraged  Follow up plan: 6 months     , FNP    

## 2021-04-15 LAB — CMP14+EGFR
ALT: 15 IU/L (ref 0–32)
AST: 21 IU/L (ref 0–40)
Albumin/Globulin Ratio: 1.3 (ref 1.2–2.2)
Albumin: 4.4 g/dL (ref 3.6–4.6)
Alkaline Phosphatase: 75 IU/L (ref 44–121)
BUN/Creatinine Ratio: 22 (ref 12–28)
BUN: 17 mg/dL (ref 8–27)
Bilirubin Total: 0.5 mg/dL (ref 0.0–1.2)
CO2: 26 mmol/L (ref 20–29)
Calcium: 9.5 mg/dL (ref 8.7–10.3)
Chloride: 101 mmol/L (ref 96–106)
Creatinine, Ser: 0.79 mg/dL (ref 0.57–1.00)
Globulin, Total: 3.3 g/dL (ref 1.5–4.5)
Glucose: 81 mg/dL (ref 65–99)
Potassium: 3.7 mmol/L (ref 3.5–5.2)
Sodium: 143 mmol/L (ref 134–144)
Total Protein: 7.7 g/dL (ref 6.0–8.5)
eGFR: 75 mL/min/{1.73_m2} (ref >59)

## 2021-04-15 LAB — LIPID PANEL
Chol/HDL Ratio: 3.7 ratio (ref 0.0–4.4)
Cholesterol, Total: 170 mg/dL (ref 100–199)
HDL: 46 mg/dL (ref >39)
LDL Chol Calc (NIH): 87 mg/dL (ref 0–99)
Triglycerides: 220 mg/dL — ABNORMAL HIGH (ref 0–149)
VLDL Cholesterol Cal: 37 mg/dL (ref 5–40)

## 2021-04-15 LAB — CBC WITH DIFFERENTIAL/PLATELET
Basophils Absolute: 0.1 10*3/uL (ref 0.0–0.2)
Basos: 1 %
EOS (ABSOLUTE): 0.4 10*3/uL (ref 0.0–0.4)
Eos: 4 %
Hematocrit: 44.2 % (ref 34.0–46.6)
Hemoglobin: 15.2 g/dL (ref 11.1–15.9)
Immature Grans (Abs): 0 10*3/uL (ref 0.0–0.1)
Immature Granulocytes: 0 %
Lymphocytes Absolute: 4.4 10*3/uL — ABNORMAL HIGH (ref 0.7–3.1)
Lymphs: 46 %
MCH: 30.6 pg (ref 26.6–33.0)
MCHC: 34.4 g/dL (ref 31.5–35.7)
MCV: 89 fL (ref 79–97)
Monocytes Absolute: 0.8 10*3/uL (ref 0.1–0.9)
Monocytes: 9 %
Neutrophils Absolute: 3.8 10*3/uL (ref 1.4–7.0)
Neutrophils: 40 %
Platelets: 209 10*3/uL (ref 150–450)
RBC: 4.96 x10E6/uL (ref 3.77–5.28)
RDW: 12.8 % (ref 11.7–15.4)
WBC: 9.4 10*3/uL (ref 3.4–10.8)

## 2021-04-15 LAB — VITAMIN D 25 HYDROXY (VIT D DEFICIENCY, FRACTURES): Vit D, 25-Hydroxy: 31.7 ng/mL (ref 30.0–100.0)

## 2021-04-16 DIAGNOSIS — M9901 Segmental and somatic dysfunction of cervical region: Secondary | ICD-10-CM | POA: Diagnosis not present

## 2021-04-16 DIAGNOSIS — M9904 Segmental and somatic dysfunction of sacral region: Secondary | ICD-10-CM | POA: Diagnosis not present

## 2021-04-16 DIAGNOSIS — M5137 Other intervertebral disc degeneration, lumbosacral region: Secondary | ICD-10-CM | POA: Diagnosis not present

## 2021-04-16 DIAGNOSIS — M9903 Segmental and somatic dysfunction of lumbar region: Secondary | ICD-10-CM | POA: Diagnosis not present

## 2021-05-14 DIAGNOSIS — M9903 Segmental and somatic dysfunction of lumbar region: Secondary | ICD-10-CM | POA: Diagnosis not present

## 2021-05-14 DIAGNOSIS — M9904 Segmental and somatic dysfunction of sacral region: Secondary | ICD-10-CM | POA: Diagnosis not present

## 2021-05-14 DIAGNOSIS — M9901 Segmental and somatic dysfunction of cervical region: Secondary | ICD-10-CM | POA: Diagnosis not present

## 2021-05-14 DIAGNOSIS — M5137 Other intervertebral disc degeneration, lumbosacral region: Secondary | ICD-10-CM | POA: Diagnosis not present

## 2021-05-31 ENCOUNTER — Other Ambulatory Visit: Payer: Self-pay | Admitting: Family

## 2021-05-31 DIAGNOSIS — I10 Essential (primary) hypertension: Secondary | ICD-10-CM

## 2021-06-01 ENCOUNTER — Telehealth: Payer: Self-pay

## 2021-06-01 ENCOUNTER — Ambulatory Visit (INDEPENDENT_AMBULATORY_CARE_PROVIDER_SITE_OTHER): Payer: Medicare Other

## 2021-06-01 VITALS — Ht 64.0 in | Wt 155.0 lb

## 2021-06-01 DIAGNOSIS — Z0000 Encounter for general adult medical examination without abnormal findings: Secondary | ICD-10-CM

## 2021-06-01 NOTE — Progress Notes (Signed)
Subjective:   Belinda Scott is a 80 y.o. female who presents for Medicare Annual (Subsequent) preventive examination.  Virtual Visit via Telephone Note  I connected with  Belinda Scott on 06/01/21 at  3:30 PM EDT by telephone and verified that I am speaking with the correct person using two identifiers.  Location: Patient: Home Provider: [facility] Persons participating in the virtual visit: patient/Nurse Health Advisor   I discussed the limitations, risks, security and privacy concerns of performing an evaluation and management service by telephone and the availability of in person appointments. The patient expressed understanding and agreed to proceed.  Interactive audio and video telecommunications were attempted between this nurse and patient, however failed, due to patient having technical difficulties OR patient did not have access to video capability.  We continued and completed visit with audio only.  Some vital signs may be absent or patient reported.   Lajuana Patchell E Maquita Sandoval, LPN   Review of Systems     Cardiac Risk Factors include: advanced age (>31men, >23 women);dyslipidemia;hypertension;sedentary lifestyle     Objective:    Today's Vitals   06/01/21 1544 06/01/21 1545  Weight: 155 lb (70.3 kg)   Height: 5\' 4"  (1.626 m)   PainSc:  10-Worst pain ever   Body mass index is 26.61 kg/m.  Advanced Directives 06/01/2021 12/23/2015 12/22/2015 12/16/2015 10/30/2015  Does Patient Have a Medical Advance Directive? Yes Yes Yes No No;Yes  Type of Paramedic of [place];Living will Living will Living will - Living will  Does patient want to make changes to medical advance directive? - No - Patient declined - Yes - information given No - Patient declined  Copy of [place] in Chart? No - copy requested No - copy requested - No - copy requested No - copy requested  Would patient like information on creating a medical advance directive? - Yes -  Educational materials given - Yes - Educational materials given No - patient declined information    Current Medications (verified) Outpatient Encounter Medications as of 06/01/2021  Medication Sig   amLODipine (NORVASC) 5 MG tablet Take 1 tablet (5 mg total) by mouth daily.   Ascorbic Acid 500 MG CHEW Chew 1 each by mouth daily.    benazepril-hydrochlorthiazide (LOTENSIN HCT) 20-25 MG tablet Take 1 tablet by mouth daily.   Cholecalciferol (VITAMIN D3) 2000 units CHEW Chew 1 each by mouth 2 (two) times a week.   HYDROcodone-acetaminophen (NORCO/VICODIN) 5-325 MG tablet Take 1 tablet by mouth every 6 (six) hours as needed for moderate pain.   lovastatin (MEVACOR) 40 MG tablet Take 1 tablet (40 mg total) by mouth at bedtime.   Magnesium 125 MG CAPS Take by mouth 4 (four) times a week.   omeprazole (PRILOSEC) 20 MG capsule Take 1 capsule (20 mg total) by mouth daily.   ondansetron (ZOFRAN-ODT) 4 MG disintegrating tablet DISSOLVE 1 TABLET IN MOUTH EVERY 8 HOURS AS NEEDED FOR NAUSEA FOR VOMITING   OVER THE COUNTER MEDICATION    Potassium 99 MG TABS Take 99 mg by mouth. Taking four times a week.   Over the counter medication.   Probiotic Product (RA PROBIOTIC GUMMIES) CHEW Chew by mouth.   No facility-administered encounter medications on file as of 06/01/2021.    Allergies (verified) Meloxicam   History: Past Medical History:  Diagnosis Date   Hyperlipidemia    Hypertension    Low back pain    Past Surgical History:  Procedure Laterality Date  ABDOMINAL HYSTERECTOMY     APPENDECTOMY     CHOLECYSTECTOMY     OVARIAN CYST SURGERY     TONSILLECTOMY     Family History  Problem Relation Age of Onset   Dementia Mother    Cancer Father    Social History   Socioeconomic History   Marital status: Married    Spouse name: Jose   Number of children: 3   Years of education: Not on file   Highest education level: Not on file  Occupational History   Occupation: retired  Tobacco Use    Smoking status: Never   Smokeless tobacco: Never  Substance and Sexual Activity   Alcohol use: No   Drug use: No   Sexual activity: Not on file  Other Topics Concern   Not on file  Social History Narrative   Lives home with her husband on one level   Her children live a couple of hours away   Social Determinants of Health   Financial Resource Strain: Low Risk    Difficulty of Paying Living Expenses: Not hard at all  Food Insecurity: No Food Insecurity   Worried About Charity fundraiser in the Last Year: Never true   Arboriculturist in the Last Year: Never true  Transportation Needs: No Transportation Needs   Lack of Transportation (Medical): No   Lack of Transportation (Non-Medical): No  Physical Activity: Insufficiently Active   Days of Exercise per Week: 7 days   Minutes of Exercise per Session: 20 min  Stress: No Stress Concern Present   Feeling of Stress : Only a little  Social Connections: Moderately Integrated   Frequency of Communication with Friends and Family: More than three times a week   Frequency of Social Gatherings with Friends and Family: Three times a week   Attends Religious Services: More than 4 times per year   Active Member of Clubs or Organizations: No   Attends Archivist Meetings: Never   Marital Status: Married    Tobacco Counseling Counseling given: Not Answered   Clinical Intake:  Pre-visit preparation completed: Yes  Pain : 0-10 Pain Score: 10-Worst pain ever Pain Type: Chronic pain Pain Location: Back Pain Orientation: Lower Pain Descriptors / Indicators: Aching, Discomfort, Sharp Pain Onset: More than a month ago Pain Frequency: Intermittent     BMI - recorded: 26.61 Nutritional Status: BMI 25 -29 Overweight Nutritional Risks: None Diabetes: No  How often do you need to have someone help you when you read instructions, pamphlets, or other written materials from your doctor or pharmacy?: 1 - Never  Diabetic?  No  Interpreter Needed?: No  Information entered by :: Austyn Perriello, LPN   Activities of Daily Living In your present state of health, do you have any difficulty performing the following activities: 06/01/2021  Hearing? N  Vision? N  Difficulty concentrating or making decisions? N  Walking or climbing stairs? Y  Dressing or bathing? N  Doing errands, shopping? N  Preparing Food and eating ? N  Using the Toilet? N  In the past six months, have you accidently leaked urine? Y  Comment pantyliner daily for protection  Do you have problems with loss of bowel control? N  Managing your Medications? N  Managing your Finances? N  Housekeeping or managing your Housekeeping? N  Some recent data might be hidden    Patient Care Team: Sharion Balloon, FNP as PCP - General (Nurse Practitioner) Ralene Muskrat as Physician Assistant (  Chiropractic Medicine)  Indicate any recent Medical Services you may have received from other than [facility] providers in the past year (date may be approximate).     Assessment:   This is a routine wellness examination for Belinda Scott.  Hearing/Vision screen Hearing Screening - Comments:: Denies hearing difficulties  Vision Screening - Comments:: Wears rx glasses - up to date with annual exams with Dr Marin Comment in [place] issues and exercise activities discussed: Current Exercise Habits: Home exercise routine, Type of exercise: walking, Time (Minutes): 20, Frequency (Times/Week): 7, Weekly Exercise (Minutes/Week): 140, Intensity: Mild, Exercise limited by: orthopedic condition(s)   Goals Addressed             This Visit's Progress    Exercise 3x per week (30 min per time)   On track      Depression Screen PHQ 2/9 Scores 06/01/2021 04/14/2021 07/04/2020 08/27/2019 05/29/2019 09/15/2018 11/30/2017  PHQ - 2 Score 2 2 0 0 0 0 2  PHQ- 9 Score 5 5 - - - - 3    Fall Risk Fall Risk  06/01/2021 07/04/2020 08/27/2019 09/15/2018 09/07/2016  Falls in the past year? 0 0  0 0 No  Number falls in past yr: 0 - - - -  Injury with Fall? 0 - - - -  Risk for fall due to : Orthopedic patient;Impaired balance/gait - - - -  Follow up Falls prevention discussed - - - -    FALL RISK PREVENTION PERTAINING TO THE HOME:  Any stairs in or around the home? No  If so, are there any without handrails? No  Home free of loose throw rugs in walkways, pet beds, electrical cords, etc? Yes  Adequate lighting in your home to reduce risk of falls? Yes   ASSISTIVE DEVICES UTILIZED TO PREVENT FALLS:  Life alert? No  Use of a cane, walker or w/c?  sometimes Grab bars in the bathroom? Yes  Shower chair or bench in shower? No  Elevated toilet seat or a handicapped toilet? Yes   TIMED UP AND GO:  Was the test performed? No . Telephonic visit  Cognitive Function: Normal cognitive status assessed by direct observation by this Nurse Health Advisor. No abnormalities found.    MMSE - Mini Mental State Exam 12/16/2015  Orientation to time 5  Orientation to Place 5  Registration 3  Attention/ Calculation 5  Recall 3  Language- name 2 objects 2  Language- repeat 1  Language- follow 3 step command 3  Language- read & follow direction 1  Write a sentence 1  Copy design 1  Total score 30     6CIT Screen 06/01/2021  What Year? 0 points  What month? 0 points  What time? 0 points  Count back from 20 0 points  Months in reverse 2 points  Repeat phrase 4 points  Total Score 6    Immunizations Immunization History  Administered Date(s) Administered   Fluad Quad(high Dose 65+) 05/29/2019, 06/11/2020   Influenza, High Dose Seasonal PF 06/08/2017, 05/19/2018   Influenza,inj,Quad PF,6+ Mos 06/07/2013, 05/31/2016   Influenza-Unspecified 04/22/2014, 06/25/2015   Moderna Sars-Covid-2 Vaccination 10/18/2019, 11/16/2019   Pneumococcal Conjugate-13 07/16/2015   Pneumococcal Polysaccharide-23 08/23/2005   Tdap 08/24/2007    TDAP status: Due, Education has been provided regarding  the importance of this vaccine. Advised may receive this vaccine at local pharmacy or Health Dept. Aware to provide a copy of the vaccination record if obtained from local pharmacy or Health Dept. Verbalized acceptance and understanding.  Flu Vaccine status: Due, Education has been provided regarding the importance of this vaccine. Advised may receive this vaccine at local pharmacy or Health Dept. Aware to provide a copy of the vaccination record if obtained from local pharmacy or Health Dept. Verbalized acceptance and understanding.  Pneumococcal vaccine status: Up to date  Covid-19 vaccine status: Completed vaccines  Qualifies for Shingles Vaccine? Yes   Zostavax completed No   Shingrix Completed?: No.    Education has been provided regarding the importance of this vaccine. Patient has been advised to call insurance company to determine out of pocket expense if they have not yet received this vaccine. Advised may also receive vaccine at local pharmacy or Health Dept. Verbalized acceptance and understanding.  Screening Tests Health Maintenance  Topic Date Due   COVID-19 Vaccine (3 - Moderna risk series) 12/14/2019   Zoster Vaccines- Shingrix (1 of 2) 07/15/2021 (Originally 10/20/1959)   INFLUENZA VACCINE  11/20/2021 (Originally 03/23/2021)   TETANUS/TDAP  04/14/2022 (Originally 08/23/2017)   DEXA SCAN  Completed   HPV VACCINES  Aged Out    Health Maintenance  Health Maintenance Due  Topic Date Due   COVID-19 Vaccine (3 - Moderna risk series) 12/14/2019    Colorectal cancer screening: No longer required.   Mammogram status: No longer required due to age.  Bone Density status: Completed 07/04/2020. Results reflect: Bone density results: OSTEOPENIA. Repeat every 2 years.  Lung Cancer Screening: (Low Dose CT Chest recommended if Age 47-80 years, 30 pack-year currently smoking OR have quit w/in 15years.) does not qualify  Additional Screening:  Hepatitis C Screening: does not  qualify  Vision Screening: Recommended annual ophthalmology exams for early detection of glaucoma and other disorders of the eye. Is the patient up to date with their annual eye exam?  Yes  Who is the provider or what is the name of the office in which the patient attends annual eye exams? [facility] If pt is not established with a provider, would they like to be referred to a provider to establish care? No .   Dental Screening: Recommended annual dental exams for proper oral hygiene  Community Resource Referral / Chronic Care Management: CRR required this visit?  No   CCM required this visit?  No      Plan:     I have personally reviewed and noted the following in the patient's chart:   Medical and social history Use of alcohol, tobacco or illicit drugs  Current medications and supplements including opioid prescriptions.  Functional ability and status Nutritional status Physical activity Advanced directives List of other physicians Hospitalizations, surgeries, and ER visits in previous 12 months Vitals Screenings to include cognitive, depression, and falls Referrals and appointments  In addition, I have reviewed and discussed with patient certain preventive protocols, quality metrics, and best practice recommendations. A written personalized care plan for preventive services as well as general preventive health recommendations were provided to patient.     Sandrea Hammond, LPN   [id number]   Nurse Notes: None

## 2021-06-01 NOTE — Telephone Encounter (Signed)
Patient c/o back pain. Says she got cortisone injection 04/14/21 and it helped a lot, but today her pain is significant and she'd like to schedule another injection asap. She refuses to go to ortho - doesn't feel comfortable getting spinal injections and only trusts [facility]. Please call patient back. Prefers morning appointments and says you can leave it on her voice mail. Cannot do this Wednesday. Thanks

## 2021-06-01 NOTE — Patient Instructions (Signed)
Belinda Scott , Thank you for taking time to come for your Medicare Wellness Visit. I appreciate your ongoing commitment to your health goals. Please review the following plan we discussed and let me know if I can assist you in the future.   Screening recommendations/referrals: Colonoscopy: Done 05/08/2013 - repeat not required Mammogram: No longer required Bone Density: Done 07/04/2020 - Repeat every 2 years Recommended yearly ophthalmology/optometry visit for glaucoma screening and checkup Recommended yearly dental visit for hygiene and checkup  Vaccinations: Influenza vaccine: Done 06/11/2020 -Repeat annually  Pneumococcal vaccine: Done 08/23/2005 & 07/16/2015 Tdap vaccine: Done 2009 - Repeat in 10 years *due Shingles vaccine: Declined   Covid-19: Done 10/18/2019 & 11/16/2019 - declines boosters  Advanced directives: Please bring a copy of your health care power of attorney and living will to the office to be added to your chart at your convenience.   Conditions/risks identified: Aim for 30 minutes of exercise or brisk walking each day, drink 6-8 glasses of water and eat lots of fruits and vegetables.   Next appointment: Follow up in one year for your annual wellness visit    Preventive Care 65 Years and Older, Female Preventive care refers to lifestyle choices and visits with your health care provider that can promote health and wellness. What does preventive care include? A yearly physical exam. This is also called an annual well check. Dental exams once or twice a year. Routine eye exams. Ask your health care provider how often you should have your eyes checked. Personal lifestyle choices, including: Daily care of your teeth and gums. Regular physical activity. Eating a healthy diet. Avoiding tobacco and drug use. Limiting alcohol use. Practicing safe sex. Taking low-dose aspirin every day. Taking vitamin and mineral supplements as recommended by your health care provider. What  happens during an annual well check? The services and screenings done by your health care provider during your annual well check will depend on your age, overall health, lifestyle risk factors, and family history of disease. Counseling  Your health care provider may ask you questions about your: Alcohol use. Tobacco use. Drug use. Emotional well-being. Home and relationship well-being. Sexual activity. Eating habits. History of falls. Memory and ability to understand (cognition). Work and work Statistician. Reproductive health. Screening  You may have the following tests or measurements: Height, weight, and BMI. Blood pressure. Lipid and cholesterol levels. These may be checked every 5 years, or more frequently if you are over 103 years old. Skin check. Lung cancer screening. You may have this screening every year starting at age 62 if you have a 30-pack-year history of smoking and currently smoke or have quit within the past 15 years. Fecal occult blood test (FOBT) of the stool. You may have this test every year starting at age 74. Flexible sigmoidoscopy or colonoscopy. You may have a sigmoidoscopy every 5 years or a colonoscopy every 10 years starting at age 27. Hepatitis C blood test. Hepatitis B blood test. Sexually transmitted disease (STD) testing. Diabetes screening. This is done by checking your blood sugar (glucose) after you have not eaten for a while (fasting). You may have this done every 1-3 years. Bone density scan. This is done to screen for osteoporosis. You may have this done starting at age 18. Mammogram. This may be done every 1-2 years. Talk to your health care provider about how often you should have regular mammograms. Talk with your health care provider about your test results, treatment options, and if necessary, the need  for more tests. Vaccines  Your health care provider may recommend certain vaccines, such as: Influenza vaccine. This is recommended every  year. Tetanus, diphtheria, and acellular pertussis (Tdap, Td) vaccine. You may need a Td booster every 10 years. Zoster vaccine. You may need this after age 79. Pneumococcal 13-valent conjugate (PCV13) vaccine. One dose is recommended after age 81. Pneumococcal polysaccharide (PPSV23) vaccine. One dose is recommended after age 70. Talk to your health care provider about which screenings and vaccines you need and how often you need them. This information is not intended to replace advice given to you by your health care provider. Make sure you discuss any questions you have with your health care provider. Document Released: 09/05/2015 Document Revised: 04/28/2016 Document Reviewed: 06/10/2015 Elsevier Interactive Patient Education  2017 [place] Prevention in the Home Falls can cause injuries. They can happen to people of all ages. There are many things you can do to make your home safe and to help prevent falls. What can I do on the outside of my home? Regularly fix the edges of walkways and driveways and fix any cracks. Remove anything that might make you trip as you walk through a door, such as a raised step or threshold. Trim any bushes or trees on the path to your home. Use bright outdoor lighting. Clear any walking paths of anything that might make someone trip, such as rocks or tools. Regularly check to see if handrails are loose or broken. Make sure that both sides of any steps have handrails. Any raised decks and porches should have guardrails on the edges. Have any leaves, snow, or ice cleared regularly. Use sand or salt on walking paths during winter. Clean up any spills in your garage right away. This includes oil or grease spills. What can I do in the bathroom? Use night lights. Install grab bars by the toilet and in the tub and shower. Do not use towel bars as grab bars. Use non-skid mats or decals in the tub or shower. If you need to sit down in the shower, use a  plastic, non-slip stool. Keep the floor dry. Clean up any water that spills on the floor as soon as it happens. Remove soap buildup in the tub or shower regularly. Attach bath mats securely with double-sided non-slip rug tape. Do not have throw rugs and other things on the floor that can make you trip. What can I do in the bedroom? Use night lights. Make sure that you have a light by your bed that is easy to reach. Do not use any sheets or blankets that are too big for your bed. They should not hang down onto the floor. Have a firm chair that has side arms. You can use this for support while you get dressed. Do not have throw rugs and other things on the floor that can make you trip. What can I do in the kitchen? Clean up any spills right away. Avoid walking on wet floors. Keep items that you use a lot in easy-to-reach places. If you need to reach something above you, use a strong step stool that has a grab bar. Keep electrical cords out of the way. Do not use floor polish or wax that makes floors slippery. If you must use wax, use non-skid floor wax. Do not have throw rugs and other things on the floor that can make you trip. What can I do with my stairs? Do not leave any items on the stairs.  Make sure that there are handrails on both sides of the stairs and use them. Fix handrails that are broken or loose. Make sure that handrails are as long as the stairways. Check any carpeting to make sure that it is firmly attached to the stairs. Fix any carpet that is loose or worn. Avoid having throw rugs at the top or bottom of the stairs. If you do have throw rugs, attach them to the floor with carpet tape. Make sure that you have a light switch at the top of the stairs and the bottom of the stairs. If you do not have them, ask someone to add them for you. What else can I do to help prevent falls? Wear shoes that: Do not have high heels. Have rubber bottoms. Are comfortable and fit you  well. Are closed at the toe. Do not wear sandals. If you use a stepladder: Make sure that it is fully opened. Do not climb a closed stepladder. Make sure that both sides of the stepladder are locked into place. Ask someone to hold it for you, if possible. Clearly mark and make sure that you can see: Any grab bars or handrails. First and last steps. Where the edge of each step is. Use tools that help you move around (mobility aids) if they are needed. These include: Canes. Walkers. Scooters. Crutches. Turn on the lights when you go into a dark area. Replace any light bulbs as soon as they burn out. Set up your furniture so you have a clear path. Avoid moving your furniture around. If any of your floors are uneven, fix them. If there are any pets around you, be aware of where they are. Review your medicines with your doctor. Some medicines can make you feel dizzy. This can increase your chance of falling. Ask your doctor what other things that you can do to help prevent falls. This information is not intended to replace advice given to you by your health care provider. Make sure you discuss any questions you have with your health care provider. Document Released: 06/05/2009 Document Revised: 01/15/2016 Document Reviewed: 09/13/2014 Elsevier Interactive Patient Education  2017 [employer].

## 2021-06-02 NOTE — Telephone Encounter (Signed)
Spoke with patient, appointment scheduled for 06/04/21 at 10:40 am with [facility]

## 2021-06-04 ENCOUNTER — Ambulatory Visit (INDEPENDENT_AMBULATORY_CARE_PROVIDER_SITE_OTHER): Payer: Medicare Other | Admitting: Family

## 2021-06-04 ENCOUNTER — Other Ambulatory Visit: Payer: Self-pay

## 2021-06-04 ENCOUNTER — Encounter: Payer: Self-pay | Admitting: Family

## 2021-06-04 VITALS — BP 151/62 | HR 73 | Temp 98.3°F | Ht 64.0 in | Wt 157.2 lb

## 2021-06-04 DIAGNOSIS — F411 Generalized anxiety disorder: Secondary | ICD-10-CM | POA: Diagnosis not present

## 2021-06-04 DIAGNOSIS — G8929 Other chronic pain: Secondary | ICD-10-CM

## 2021-06-04 DIAGNOSIS — I10 Essential (primary) hypertension: Secondary | ICD-10-CM | POA: Diagnosis not present

## 2021-06-04 DIAGNOSIS — M545 Low back pain, unspecified: Secondary | ICD-10-CM | POA: Diagnosis not present

## 2021-06-04 DIAGNOSIS — Z23 Encounter for immunization: Secondary | ICD-10-CM

## 2021-06-04 MED ORDER — KETOROLAC TROMETHAMINE 60 MG/2ML IM SOLN
60.0000 mg | Freq: Once | INTRAMUSCULAR | Status: AC
  Administered 2021-06-04: 60 mg via INTRAMUSCULAR

## 2021-06-04 MED ORDER — BUSPIRONE HCL 5 MG PO TABS: 5.0000 mg | ORAL_TABLET | Freq: Two times a day (BID) | ORAL | 2 refills | Status: AC | PRN

## 2021-06-04 MED ORDER — HYDROCODONE-ACETAMINOPHEN 5-325 MG PO TABS: 1.0000 | ORAL_TABLET | Freq: Four times a day (QID) | ORAL | 0 refills | Status: DC | PRN

## 2021-06-04 MED ORDER — METHYLPREDNISOLONE ACETATE 80 MG/ML IJ SUSP
80.0000 mg | Freq: Once | INTRAMUSCULAR | Status: AC
  Administered 2021-06-04: 80 mg via INTRAMUSCULAR

## 2021-06-04 NOTE — Progress Notes (Signed)
Subjective:    Patient ID: Belinda Scott, female    DOB: 09/29/1940, 80 y.o.   MRN: [id number]  Chief Complaint  Patient presents with   Back Pain    Years     Back Pain This is a chronic problem. The current episode started more than 1 year ago. The problem occurs intermittently. The problem has been waxing and waning since onset. The pain is present in the lumbar spine. The quality of the pain is described as aching. The pain is at a severity of 8/10. The pain is moderate. The symptoms are aggravated by sitting and standing. Pertinent negatives include no bowel incontinence, chest pain, perianal numbness or weakness. Risk factors include obesity. Treatments tried: norco. The treatment provided mild relief.  Anxiety Presents for follow-up visit. Symptoms include excessive worry, irritability and nervous/anxious behavior. Patient reports no chest pain or shortness of breath.    Hypertension This is a chronic problem. The current episode started more than 1 year ago. The problem has been waxing and waning since onset. Associated symptoms include anxiety and malaise/fatigue. Pertinent negatives include no chest pain, peripheral edema or shortness of breath.     Review of Systems  Constitutional:  Positive for irritability and malaise/fatigue.  Respiratory:  Negative for shortness of breath.   Cardiovascular:  Negative for chest pain.  Gastrointestinal:  Negative for bowel incontinence.  Musculoskeletal:  Positive for back pain.  Neurological:  Negative for weakness.  Psychiatric/Behavioral:  The patient is nervous/anxious.   All other systems reviewed and are negative.     Objective:   Physical Exam Vitals reviewed.  Constitutional:      General: She is not in acute distress.    Appearance: She is well-developed. She is obese.  HENT:     Head: Normocephalic and atraumatic.     Right Ear: Tympanic membrane normal.     Left Ear: Tympanic membrane normal.  Eyes:     Pupils:  Pupils are equal, round, and reactive to light.  Neck:     Thyroid: No thyromegaly.  Cardiovascular:     Rate and Rhythm: Normal rate and regular rhythm.     Heart sounds: Normal heart sounds. No murmur heard. Pulmonary:     Effort: Pulmonary effort is normal. No respiratory distress.     Breath sounds: Normal breath sounds. No wheezing.  Abdominal:     General: Bowel sounds are normal. There is no distension.     Palpations: Abdomen is soft.     Tenderness: There is no abdominal tenderness.  Musculoskeletal:        General: No tenderness.     Cervical back: Normal range of motion and neck supple.     Comments: Pain in lumbar with flexion and extension  Skin:    General: Skin is warm and dry.  Neurological:     Mental Status: She is alert and oriented to person, place, and time.     Cranial Nerves: No cranial nerve deficit.     Deep Tendon Reflexes: Reflexes are normal and symmetric.  Psychiatric:        Behavior: Behavior normal.        Thought Content: Thought content normal.        Judgment: Judgment normal.     BP (!) 151/62   Pulse 73   Temp 98.3 F (36.8 C) (Temporal)   Ht 5\' 4"  (1.626 m)   Wt 157 lb 3.2 oz (71.3 kg)   BMI 26.98 kg/m  Assessment & Plan:  Belinda Scott comes in today with chief complaint of Back Pain (Years )   Diagnosis and orders addressed:  1. Need for immunization against influenza - Flu Vaccine QUAD High Dose(Fluad)  2. Chronic bilateral low back pain, unspecified whether sciatica present Will refill Norco to take as needed ROM exercises encouraged  - methylPREDNISolone acetate (DEPO-MEDROL) injection 80 mg - ketorolac (TORADOL) injection 60 mg - HYDROcodone-acetaminophen (NORCO/VICODIN) 5-325 MG tablet; Take 1 tablet by mouth every 6 (six) hours as needed for moderate pain.  Dispense: 30 tablet; Refill: 0  3. GAD (generalized anxiety disorder) Start Buspar TID prn - HYDROcodone-acetaminophen (NORCO/VICODIN) 5-325 MG tablet;  Take 1 tablet by mouth every 6 (six) hours as needed for moderate pain.  Dispense: 30 tablet; Refill: 0  4. Essential hypertension Continue medicaitons    Follow up plan: Keep chronic follow up   Evelina Dun, FNP

## 2021-06-04 NOTE — Patient Instructions (Signed)

## 2021-06-15 DIAGNOSIS — M9904 Segmental and somatic dysfunction of sacral region: Secondary | ICD-10-CM | POA: Diagnosis not present

## 2021-06-15 DIAGNOSIS — M9903 Segmental and somatic dysfunction of lumbar region: Secondary | ICD-10-CM | POA: Diagnosis not present

## 2021-06-15 DIAGNOSIS — M5137 Other intervertebral disc degeneration, lumbosacral region: Secondary | ICD-10-CM | POA: Diagnosis not present

## 2021-06-15 DIAGNOSIS — M9901 Segmental and somatic dysfunction of cervical region: Secondary | ICD-10-CM | POA: Diagnosis not present

## 2021-07-29 ENCOUNTER — Other Ambulatory Visit: Payer: Self-pay | Admitting: Family

## 2021-07-29 DIAGNOSIS — I10 Essential (primary) hypertension: Secondary | ICD-10-CM

## 2021-08-04 ENCOUNTER — Encounter: Payer: Self-pay | Admitting: Family

## 2021-08-04 ENCOUNTER — Ambulatory Visit (INDEPENDENT_AMBULATORY_CARE_PROVIDER_SITE_OTHER): Payer: Medicare Other | Admitting: Family

## 2021-08-04 VITALS — BP 156/76 | HR 81 | Temp 97.5°F | Ht 64.0 in | Wt 159.6 lb

## 2021-08-04 DIAGNOSIS — E663 Overweight: Secondary | ICD-10-CM

## 2021-08-04 DIAGNOSIS — F411 Generalized anxiety disorder: Secondary | ICD-10-CM | POA: Diagnosis not present

## 2021-08-04 DIAGNOSIS — G8929 Other chronic pain: Secondary | ICD-10-CM | POA: Diagnosis not present

## 2021-08-04 DIAGNOSIS — K219 Gastro-esophageal reflux disease without esophagitis: Secondary | ICD-10-CM

## 2021-08-04 DIAGNOSIS — E559 Vitamin D deficiency, unspecified: Secondary | ICD-10-CM | POA: Diagnosis not present

## 2021-08-04 DIAGNOSIS — I10 Essential (primary) hypertension: Secondary | ICD-10-CM

## 2021-08-04 DIAGNOSIS — E785 Hyperlipidemia, unspecified: Secondary | ICD-10-CM

## 2021-08-04 DIAGNOSIS — M159 Polyosteoarthritis, unspecified: Secondary | ICD-10-CM

## 2021-08-04 DIAGNOSIS — M545 Low back pain, unspecified: Secondary | ICD-10-CM | POA: Diagnosis not present

## 2021-08-04 DIAGNOSIS — Z0289 Encounter for other administrative examinations: Secondary | ICD-10-CM

## 2021-08-04 MED ORDER — SULINDAC 150 MG PO TABS: 150.0000 mg | ORAL_TABLET | Freq: Two times a day (BID) | ORAL | 1 refills | Status: DC

## 2021-08-04 MED ORDER — OMEPRAZOLE 40 MG PO CPDR: 40.0000 mg | DELAYED_RELEASE_CAPSULE | Freq: Every day | ORAL | 1 refills | Status: DC

## 2021-08-04 NOTE — Patient Instructions (Signed)
Osteoarthritis Osteoarthritis is a type of arthritis. It refers to joint pain or joint disease. Osteoarthritis affects tissue that covers the ends of bones in joints (cartilage). Cartilage acts as a cushion between the bones and helps them move smoothly. Osteoarthritis occurs when cartilage in the joints gets worn down. Osteoarthritis is sometimes called "wear and tear" arthritis. Osteoarthritis is the most common form of arthritis. It often occurs in older people. It is a condition that gets worse over time. The joints most often affected by this condition are in the fingers, toes, hips, knees, and spine, including the neck and lower back. What are the causes? This condition is caused by the wearing down of cartilage that covers the ends of bones. What increases the risk? The following factors may make you more likely to develop this condition: Being age 50 or older. Obesity. Overuse of joints. Past injury of a joint. Past surgery on a joint. Family history of osteoarthritis. What are the signs or symptoms? The main symptoms of this condition are pain, swelling, and stiffness in the joint. Other symptoms may include: An enlarged joint. More pain and further damage caused by small pieces of bone or cartilage that break off and float inside of the joint. Small deposits of bone (osteophytes) that grow on the edges of the joint. A grating or scraping feeling inside the joint when you move it. Popping or creaking sounds when you move. Difficulty walking or exercising. An inability to grip items, twist your hand(s), or control the movements of your hands and fingers. How is this diagnosed? This condition may be diagnosed based on: Your medical history. A physical exam. Your symptoms. X-rays of the affected joint(s). Blood tests to rule out other types of arthritis. How is this treated? There is no cure for this condition, but treatment can help control pain and improve joint function.  Treatment may include a combination of therapies, such as: Pain relief techniques, such as: Applying heat and cold to the joint. Massage. A form of talk therapy called cognitive behavioral therapy (CBT). This therapy helps you set goals and follow up on the changes that you make. Medicines for pain and inflammation. The medicines can be taken by mouth or applied to the skin. They include: NSAIDs, such as ibuprofen. Prescription medicines. Strong anti-inflammatory medicines (corticosteroids). Certain nutritional supplements. A prescribed exercise program. You may work with a physical therapist. Assistive devices, such as a brace, wrap, splint, specialized glove, or cane. A weight control plan. Surgery, such as: An osteotomy. This is done to reposition the bones and relieve pain or to remove loose pieces of bone and cartilage. Joint replacement surgery. You may need this surgery if you have advanced osteoarthritis. Follow these instructions at home: Activity Rest your affected joints as told by your health care provider. Exercise as told by your health care provider. He or she may recommend specific types of exercise, such as: Strengthening exercises. These are done to strengthen the muscles that support joints affected by arthritis. Aerobic activities. These are exercises, such as brisk walking or water aerobics, that increase your heart rate. Range-of-motion activities. These help your joints move more easily. Balance and agility exercises. Managing pain, stiffness, and swelling   If directed, apply heat to the affected area as often as told by your health care provider. Use the heat source that your health care provider recommends, such as a moist heat pack or a heating pad. If you have a removable assistive device, remove it as told by   your health care provider. Place a towel between your skin and the heat source. If your health care provider tells you to keep the assistive device on  while you apply heat, place a towel between the assistive device and the heat source. Leave the heat on for 20-30 minutes. Remove the heat if your skin turns bright red. This is especially important if you are unable to feel pain, heat, or cold. You may have a greater risk of getting burned. If directed, put ice on the affected area. To do this: If you have a removable assistive device, remove it as told by your health care provider. Put ice in a plastic bag. Place a towel between your skin and the bag. If your health care provider tells you to keep the assistive device on during icing, place a towel between the assistive device and the bag. Leave the ice on for 20 minutes, 2-3 times a day. Move your fingers or toes often to reduce stiffness and swelling. Raise (elevate) the injured area above the level of your heart while you are sitting or lying down. General instructions Take over-the-counter and prescription medicines only as told by your health care provider. Maintain a healthy weight. Follow instructions from your health care provider for weight control. Do not use any products that contain nicotine or tobacco, such as cigarettes, e-cigarettes, and chewing tobacco. If you need help quitting, ask your health care provider. Use assistive devices as told by your health care provider. Keep all follow-up visits as told by your health care provider. This is important. Where to find more information National Institute of Arthritis and Musculoskeletal and Skin Diseases: www.niams.nih.gov National Institute on Aging: www.nia.nih.gov American College of Rheumatology: www.rheumatology.org Contact a health care provider if: You have redness, swelling, or a feeling of warmth in a joint that gets worse. You have a fever along with joint or muscle aches. You develop a rash. You have trouble doing your normal activities. Get help right away if: You have pain that gets worse and is not relieved by  pain medicine. Summary Osteoarthritis is a type of arthritis that affects tissue covering the ends of bones in joints (cartilage). This condition is caused by the wearing down of cartilage that covers the ends of bones. The main symptom of this condition is pain, swelling, and stiffness in the joint. There is no cure for this condition, but treatment can help control pain and improve joint function. This information is not intended to replace advice given to you by your health care provider. Make sure you discuss any questions you have with your health care provider. Document Revised: 08/06/2019 Document Reviewed: 08/06/2019 Elsevier Patient Education  2022 [employer]  

## 2021-08-04 NOTE — Progress Notes (Signed)
Subjective:    Patient ID: Belinda Scott, female    DOB: [birth year], 80 y.o.   MRN: [id number]  Chief Complaint  Patient presents with   Medication Reaction    Cant take the pain meds she is on makes her stomach hurt too bad. She wants to try sulindac. Wants amox for her stomach   PT presents to the office today for chronic follow up. She reports she can not take Norco  because it causes stomach pains. She is requesting  sulindac prescription.  Hypertension This is a chronic problem. The current episode started more than 1 year ago. The problem has been waxing and waning since onset. Associated symptoms include anxiety and malaise/fatigue. Pertinent negatives include no peripheral edema or shortness of breath. Risk factors for coronary artery disease include dyslipidemia and obesity. The current treatment provides moderate improvement. There is no history of heart failure.  Arthritis Presents for follow-up visit. She complains of pain and stiffness. The symptoms have been stable. Affected locations include the left knee, right knee, right MCP and left MCP. Her pain is at a severity of 8/10.  Back Pain This is a chronic problem. The current episode started more than 1 year ago. The problem occurs intermittently. The problem has been waxing and waning since onset. The pain is present in the lumbar spine. The quality of the pain is described as aching. The pain is at a severity of 8/10.  Hyperlipidemia This is a chronic problem. The current episode started more than 1 year ago. Pertinent negatives include no shortness of breath. Current antihyperlipidemic treatment includes statins. The current treatment provides moderate improvement of lipids. Risk factors for coronary artery disease include dyslipidemia, hypertension, a sedentary lifestyle and post-menopausal.  Anxiety Presents for follow-up visit. Symptoms include depressed mood, excessive worry, irritability, nervous/anxious behavior and  restlessness. Patient reports no shortness of breath. Symptoms occur most days. The severity of symptoms is moderate.       Review of Systems  Constitutional:  Positive for irritability and malaise/fatigue.  Respiratory:  Negative for shortness of breath.   Musculoskeletal:  Positive for arthritis, back pain and stiffness.  Psychiatric/Behavioral:  The patient is nervous/anxious.   All other systems reviewed and are negative.     Objective:   Physical Exam Vitals reviewed.  Constitutional:      General: She is not in acute distress.    Appearance: She is well-developed.  HENT:     Head: Normocephalic and atraumatic.     Right Ear: Tympanic membrane normal.     Left Ear: Tympanic membrane normal.  Eyes:     Pupils: Pupils are equal, round, and reactive to light.  Neck:     Thyroid: No thyromegaly.  Cardiovascular:     Rate and Rhythm: Normal rate and regular rhythm.     Heart sounds: Normal heart sounds. No murmur heard. Pulmonary:     Effort: Pulmonary effort is normal. No respiratory distress.     Breath sounds: Normal breath sounds. No wheezing.  Abdominal:     General: Bowel sounds are normal. There is no distension.     Palpations: Abdomen is soft.     Tenderness: There is no abdominal tenderness.  Musculoskeletal:        General: No tenderness. Normal range of motion.     Cervical back: Normal range of motion and neck supple.  Skin:    General: Skin is warm and dry.  Neurological:     Mental Status: She  is alert and oriented to person, place, and time.     Cranial Nerves: No cranial nerve deficit.     Deep Tendon Reflexes: Reflexes are normal and symmetric.  Psychiatric:        Behavior: Behavior normal.        Thought Content: Thought content normal.        Judgment: Judgment normal.      BP (!) 156/76    Pulse 81    Temp (!) 97.5 F (36.4 C) (Temporal)    Ht 5\' 4"  (1.626 m)    Wt 159 lb 9.6 oz (72.4 kg)    BMI 27.40 kg/m      Assessment & Plan:   Belinda Scott comes in today with chief complaint of Medication Reaction (Cant take the pain meds she is on makes her stomach hurt too bad. She wants to try sulindac. Wants amox for her stomach)   Diagnosis and orders addressed:  1. Essential hypertension  2. Osteoarthritis of multiple joints, unspecified osteoarthritis type  3. GAD (generalized anxiety disorder)  4. Pain management contract signed  5. Hyperlipidemia, unspecified hyperlipidemia type  6. Chronic bilateral low back pain, unspecified whether sciatica present Will give her sulindac  I have increased her PPI to 40 mg daily No other NSAID's  - sulindac (CLINORIL) 150 MG tablet; Take 1 tablet (150 mg total) by mouth 2 (two) times daily.  Dispense: 30 tablet; Refill: 1  7. Overweight (BMI 25.0-29.9)  8. Vitamin D deficiency  9. Gastroesophageal reflux disease, unspecified whether esophagitis present Increased to 40 mg daily from 20 mg - omeprazole (PRILOSEC) 40 MG capsule; Take 1 capsule (40 mg total) by mouth daily.  Dispense: 90 capsule; Refill: 1   Labs pending Health Maintenance reviewed Diet and exercise encouraged  Follow up plan: 3 months   Evelina Dun, FNP

## 2021-08-25 DIAGNOSIS — M9904 Segmental and somatic dysfunction of sacral region: Secondary | ICD-10-CM | POA: Diagnosis not present

## 2021-08-25 DIAGNOSIS — M5137 Other intervertebral disc degeneration, lumbosacral region: Secondary | ICD-10-CM | POA: Diagnosis not present

## 2021-08-25 DIAGNOSIS — M9903 Segmental and somatic dysfunction of lumbar region: Secondary | ICD-10-CM | POA: Diagnosis not present

## 2021-08-25 DIAGNOSIS — M9901 Segmental and somatic dysfunction of cervical region: Secondary | ICD-10-CM | POA: Diagnosis not present

## 2021-09-08 ENCOUNTER — Telehealth: Payer: Self-pay | Admitting: Family

## 2021-09-08 NOTE — Telephone Encounter (Signed)
It should at least be 3 months, but I like closer to 4-6 months.   Evelina Dun, FNP

## 2021-09-08 NOTE — Telephone Encounter (Signed)
Last injection was 06/04/21

## 2021-09-08 NOTE — Telephone Encounter (Signed)
Pt Is wanting to know if she is able to get a cortisol shot at this time. She was unsure if she was due for one.

## 2021-09-08 NOTE — Telephone Encounter (Signed)
Patient aware, she will wait 4 months and scheduled an appointment for 10/08/21.

## 2021-09-14 ENCOUNTER — Other Ambulatory Visit: Payer: Self-pay | Admitting: Family

## 2021-09-14 DIAGNOSIS — K219 Gastro-esophageal reflux disease without esophagitis: Secondary | ICD-10-CM

## 2021-09-22 DIAGNOSIS — M9904 Segmental and somatic dysfunction of sacral region: Secondary | ICD-10-CM | POA: Diagnosis not present

## 2021-09-22 DIAGNOSIS — M9903 Segmental and somatic dysfunction of lumbar region: Secondary | ICD-10-CM | POA: Diagnosis not present

## 2021-09-22 DIAGNOSIS — M5137 Other intervertebral disc degeneration, lumbosacral region: Secondary | ICD-10-CM | POA: Diagnosis not present

## 2021-09-22 DIAGNOSIS — M9901 Segmental and somatic dysfunction of cervical region: Secondary | ICD-10-CM | POA: Diagnosis not present

## 2021-09-23 ENCOUNTER — Other Ambulatory Visit: Payer: Self-pay | Admitting: Family

## 2021-09-23 DIAGNOSIS — M545 Low back pain, unspecified: Secondary | ICD-10-CM

## 2021-09-23 DIAGNOSIS — G8929 Other chronic pain: Secondary | ICD-10-CM

## 2021-10-08 ENCOUNTER — Encounter: Payer: Self-pay | Admitting: Family

## 2021-10-08 ENCOUNTER — Ambulatory Visit (INDEPENDENT_AMBULATORY_CARE_PROVIDER_SITE_OTHER): Payer: Medicare Other | Admitting: Family

## 2021-10-08 VITALS — BP 153/70 | HR 86 | Temp 99.1°F | Ht 64.0 in | Wt 163.8 lb

## 2021-10-08 DIAGNOSIS — M159 Polyosteoarthritis, unspecified: Secondary | ICD-10-CM

## 2021-10-08 DIAGNOSIS — E663 Overweight: Secondary | ICD-10-CM | POA: Diagnosis not present

## 2021-10-08 DIAGNOSIS — M545 Low back pain, unspecified: Secondary | ICD-10-CM

## 2021-10-08 DIAGNOSIS — G8929 Other chronic pain: Secondary | ICD-10-CM

## 2021-10-08 DIAGNOSIS — I10 Essential (primary) hypertension: Secondary | ICD-10-CM

## 2021-10-08 DIAGNOSIS — F411 Generalized anxiety disorder: Secondary | ICD-10-CM

## 2021-10-08 MED ORDER — KETOROLAC TROMETHAMINE 60 MG/2ML IM SOLN
60.0000 mg | Freq: Once | INTRAMUSCULAR | Status: AC
  Administered 2021-10-08: 60 mg via INTRAMUSCULAR

## 2021-10-08 MED ORDER — METHYLPREDNISOLONE ACETATE 80 MG/ML IJ SUSP
80.0000 mg | Freq: Once | INTRAMUSCULAR | Status: AC
  Administered 2021-10-08: 80 mg via INTRAMUSCULAR

## 2021-10-08 MED ORDER — SULINDAC 150 MG PO TABS: 150.0000 mg | ORAL_TABLET | Freq: Two times a day (BID) | ORAL | 5 refills | Status: DC

## 2021-10-08 NOTE — Patient Instructions (Signed)

## 2021-10-08 NOTE — Progress Notes (Signed)
° °Subjective:  ° ° Patient ID: Belinda Scott, female    DOB: 01/01/1941, 80 y.o.   MRN: [id number] ° °Chief Complaint  °Patient presents with  ° Back Pain  °  WANTS SHOTS   ° °PT presents to the office today for chronic follow up. She reports she can not take Norco  because it causes stomach pains. She is currently taking sulindac prescription that helps.  °Back Pain °This is a chronic problem. The current episode started more than 1 year ago. The problem occurs intermittently. The problem has been waxing and waning since onset. The pain is present in the lumbar spine. The quality of the pain is described as aching. The pain is at a severity of 10/10. The pain is moderate. Associated symptoms include weakness. Pertinent negatives include no fever or pelvic pain. She has tried bed rest and NSAIDs for the symptoms. The treatment provided moderate relief.  °Hypertension °This is a chronic problem. The current episode started more than 1 year ago. The problem has been waxing and waning since onset. The problem is uncontrolled. Associated symptoms include anxiety, malaise/fatigue and peripheral edema. Pertinent negatives include no shortness of breath. Risk factors for coronary artery disease include dyslipidemia and obesity. The current treatment provides no improvement.  °Arthritis °Presents for follow-up visit. She complains of pain and stiffness. Affected locations include the left ankle and right ankle. Her pain is at a severity of 10/10. Pertinent negatives include no fever.  °Anxiety °Presents for follow-up visit. Symptoms include depressed mood, irritability and nervous/anxious behavior. Patient reports no shortness of breath.  ° ° ° ° ° °Review of Systems  °Constitutional:  Positive for irritability and malaise/fatigue. Negative for fever.  °Respiratory:  Negative for shortness of breath.   °Genitourinary:  Negative for pelvic pain.  °Musculoskeletal:  Positive for arthritis, back pain and stiffness.   °Neurological:  Positive for weakness.  °Psychiatric/Behavioral:  The patient is nervous/anxious.   °All other systems reviewed and are negative. ° °   °Objective:  ° Physical Exam °Vitals reviewed.  °Constitutional:   °   General: She is not in acute distress. °   Appearance: She is well-developed.  °HENT:  °   Head: Normocephalic and atraumatic.  °Eyes:  °   Pupils: Pupils are equal, round, and reactive to light.  °Neck:  °   Thyroid: No thyromegaly.  °Cardiovascular:  °   Rate and Rhythm: Normal rate and regular rhythm.  °   Heart sounds: Normal heart sounds. No murmur heard. °Pulmonary:  °   Effort: Pulmonary effort is normal. No respiratory distress.  °   Breath sounds: Normal breath sounds. No wheezing.  °Abdominal:  °   General: Bowel sounds are normal. There is no distension.  °   Palpations: Abdomen is soft.  °   Tenderness: There is no abdominal tenderness.  °Musculoskeletal:     °   General: No tenderness.  °   Cervical back: Normal range of motion and neck supple.  °   Right lower leg: Edema (2+) present.  °   Left lower leg: Edema (2+) present.  °   Comments: Pain in lumbar with flexion and extension   °Skin: °   General: Skin is warm and dry.  °Neurological:  °   Mental Status: She is alert and oriented to person, place, and time.  °   Cranial Nerves: No cranial nerve deficit.  °   Deep Tendon Reflexes: Reflexes are normal and symmetric.  °  Psychiatric:     °   Behavior: Behavior normal.     °   Thought Content: Thought content normal.     °   Judgment: Judgment normal.  ° ° ° ° ° °  BP (!) 153/70    Pulse 86    Temp 99.1 °F (37.3 °C) (Temporal)    Ht 5' 4" (1.626 m)    Wt 163 lb 12.8 oz (74.3 kg)    BMI 28.12 kg/m²  ° °Assessment & Plan:  °Belinda Scott comes in today with chief complaint of Back Pain (WANTS SHOTS ) ° ° °Diagnosis and orders addressed: ° °1. Chronic bilateral low back pain, unspecified whether sciatica present °- sulindac (CLINORIL) 150 MG tablet; Take 1 tablet (150 mg total) by  mouth 2 (two) times daily.  Dispense: 60 tablet; Refill: 5 °- CMP14+EGFR °- methylPREDNISolone acetate (DEPO-MEDROL) injection 80 mg °- ketorolac (TORADOL) injection 60 mg ° °2. Essential hypertension °- CMP14+EGFR ° °3. Osteoarthritis of multiple joints, unspecified osteoarthritis type °- CMP14+EGFR °- methylPREDNISolone acetate (DEPO-MEDROL) injection 80 mg °- ketorolac (TORADOL) injection 60 mg ° °4. Overweight (BMI 25.0-29.9) °- CMP14+EGFR ° °5. GAD (generalized anxiety disorder) °- CMP14+EGFR ° ° °Labs pending °Health Maintenance reviewed °Diet and exercise encouraged ° °Follow up plan: °6 months  ° ° ° , FNP ° ° ° °

## 2021-10-09 LAB — CMP14+EGFR
ALT: 20 IU/L (ref 0–32)
AST: 28 IU/L (ref 0–40)
Albumin/Globulin Ratio: 1.8 (ref 1.2–2.2)
Albumin: 4.7 g/dL — ABNORMAL HIGH (ref 3.6–4.6)
Alkaline Phosphatase: 80 IU/L (ref 44–121)
BUN/Creatinine Ratio: 20 (ref 12–28)
BUN: 16 mg/dL (ref 8–27)
Bilirubin Total: 0.5 mg/dL (ref 0.0–1.2)
CO2: 26 mmol/L (ref 20–29)
Calcium: 9.9 mg/dL (ref 8.7–10.3)
Chloride: 103 mmol/L (ref 96–106)
Creatinine, Ser: 0.82 mg/dL (ref 0.57–1.00)
Globulin, Total: 2.6 g/dL (ref 1.5–4.5)
Glucose: 98 mg/dL (ref 70–99)
Potassium: 3.9 mmol/L (ref 3.5–5.2)
Sodium: 146 mmol/L — ABNORMAL HIGH (ref 134–144)
Total Protein: 7.3 g/dL (ref 6.0–8.5)
eGFR: 71 mL/min/{1.73_m2} (ref >59)

## 2021-10-20 DIAGNOSIS — M9901 Segmental and somatic dysfunction of cervical region: Secondary | ICD-10-CM | POA: Diagnosis not present

## 2021-10-20 DIAGNOSIS — M9904 Segmental and somatic dysfunction of sacral region: Secondary | ICD-10-CM | POA: Diagnosis not present

## 2021-10-20 DIAGNOSIS — M5137 Other intervertebral disc degeneration, lumbosacral region: Secondary | ICD-10-CM | POA: Diagnosis not present

## 2021-10-20 DIAGNOSIS — M9903 Segmental and somatic dysfunction of lumbar region: Secondary | ICD-10-CM | POA: Diagnosis not present

## 2021-11-19 DIAGNOSIS — M9904 Segmental and somatic dysfunction of sacral region: Secondary | ICD-10-CM | POA: Diagnosis not present

## 2021-11-19 DIAGNOSIS — M9901 Segmental and somatic dysfunction of cervical region: Secondary | ICD-10-CM | POA: Diagnosis not present

## 2021-11-19 DIAGNOSIS — M5137 Other intervertebral disc degeneration, lumbosacral region: Secondary | ICD-10-CM | POA: Diagnosis not present

## 2021-11-19 DIAGNOSIS — M9903 Segmental and somatic dysfunction of lumbar region: Secondary | ICD-10-CM | POA: Diagnosis not present

## 2021-12-03 DIAGNOSIS — R21 Rash and other nonspecific skin eruption: Secondary | ICD-10-CM | POA: Diagnosis not present

## 2021-12-17 DIAGNOSIS — M5137 Other intervertebral disc degeneration, lumbosacral region: Secondary | ICD-10-CM | POA: Diagnosis not present

## 2021-12-17 DIAGNOSIS — M9904 Segmental and somatic dysfunction of sacral region: Secondary | ICD-10-CM | POA: Diagnosis not present

## 2021-12-17 DIAGNOSIS — M9901 Segmental and somatic dysfunction of cervical region: Secondary | ICD-10-CM | POA: Diagnosis not present

## 2021-12-17 DIAGNOSIS — M9903 Segmental and somatic dysfunction of lumbar region: Secondary | ICD-10-CM | POA: Diagnosis not present

## 2022-01-14 DIAGNOSIS — M9904 Segmental and somatic dysfunction of sacral region: Secondary | ICD-10-CM | POA: Diagnosis not present

## 2022-01-14 DIAGNOSIS — M9901 Segmental and somatic dysfunction of cervical region: Secondary | ICD-10-CM | POA: Diagnosis not present

## 2022-01-14 DIAGNOSIS — M5137 Other intervertebral disc degeneration, lumbosacral region: Secondary | ICD-10-CM | POA: Diagnosis not present

## 2022-01-14 DIAGNOSIS — M9903 Segmental and somatic dysfunction of lumbar region: Secondary | ICD-10-CM | POA: Diagnosis not present

## 2022-02-11 DIAGNOSIS — M9904 Segmental and somatic dysfunction of sacral region: Secondary | ICD-10-CM | POA: Diagnosis not present

## 2022-02-11 DIAGNOSIS — M5137 Other intervertebral disc degeneration, lumbosacral region: Secondary | ICD-10-CM | POA: Diagnosis not present

## 2022-02-11 DIAGNOSIS — M9903 Segmental and somatic dysfunction of lumbar region: Secondary | ICD-10-CM | POA: Diagnosis not present

## 2022-02-11 DIAGNOSIS — M9901 Segmental and somatic dysfunction of cervical region: Secondary | ICD-10-CM | POA: Diagnosis not present

## 2022-02-22 ENCOUNTER — Ambulatory Visit (INDEPENDENT_AMBULATORY_CARE_PROVIDER_SITE_OTHER): Payer: Medicare Other | Admitting: Nurse Practitioner

## 2022-02-22 ENCOUNTER — Encounter: Payer: Self-pay | Admitting: Nurse Practitioner

## 2022-02-22 VITALS — BP 155/68 | HR 74 | Temp 98.6°F | Resp 20 | Ht 64.0 in | Wt 162.0 lb

## 2022-02-22 DIAGNOSIS — R6889 Other general symptoms and signs: Secondary | ICD-10-CM | POA: Diagnosis not present

## 2022-02-22 DIAGNOSIS — R609 Edema, unspecified: Secondary | ICD-10-CM

## 2022-02-22 DIAGNOSIS — I10 Essential (primary) hypertension: Secondary | ICD-10-CM | POA: Diagnosis not present

## 2022-02-22 MED ORDER — FUROSEMIDE 20 MG PO TABS: 20.0000 mg | ORAL_TABLET | Freq: Every day | ORAL | 3 refills | Status: AC

## 2022-02-22 MED ORDER — BENAZEPRIL HCL 20 MG PO TABS: 20.0000 mg | ORAL_TABLET | Freq: Every day | ORAL | 3 refills | Status: DC

## 2022-02-22 NOTE — Patient Instructions (Signed)
Peripheral Edema  Peripheral edema is swelling that is caused by a buildup of fluid. Peripheral edema most often affects the lower legs, ankles, and feet. It can also develop in the arms, hands, and face. The area of the body that has peripheral edema will look swollen. It may also feel heavy or warm. Your clothes may start to feel tight. Pressing on the area may make a temporary dent in your skin (pitting edema). You may not be able to move your swollen arm or leg as much as usual. There are many causes of peripheral edema. It can happen because of a complication of other conditions such as heart failure, kidney disease, or a problem with your circulation. It also can be a side effect of certain medicines or happen because of an infection. It often happens to women during pregnancy. Sometimes, the cause is not known. Follow these instructions at home: Managing pain, stiffness, and swelling  Raise (elevate) your legs while you are sitting or lying down. Move around often to prevent stiffness and to reduce swelling. Do not sit or stand for long periods of time. Do not wear tight clothing. Do not wear garters on your upper legs. Exercise your legs to get your circulation going. This helps to move the fluid back into your blood vessels, and it may help the swelling go down. Wear compression stockings as told by your health care provider. These stockings help to prevent blood clots and reduce swelling in your legs. It is important that these are the correct size. These stockings should be prescribed by your doctor to prevent possible injuries. If elastic bandages or wraps are recommended, use them as told by your health care provider. Medicines Take over-the-counter and prescription medicines only as told by your health care provider. Your health care provider may prescribe medicine to help your body get rid of excess water (diuretic). Take this medicine if you are told to take it. General  instructions Eat a low-salt (low-sodium) diet as told by your health care provider. Sometimes, eating less salt may reduce swelling. Pay attention to any changes in your symptoms. Moisturize your skin daily to help prevent skin from cracking and draining. Keep all follow-up visits. This is important. Contact a health care provider if: You have a fever. You have swelling in only one leg. You have increased swelling, redness, or pain in one or both of your legs. You have drainage or sores at the area where you have edema. Get help right away if: You have edema that starts suddenly or is getting worse, especially if you are pregnant or have a medical condition. You develop shortness of breath, especially when you are lying down. You have pain in your chest or abdomen. You feel weak. You feel like you will faint. These symptoms may be an emergency. Get help right away. Call 911. Do not wait to see if the symptoms will go away. Do not drive yourself to the hospital. Summary Peripheral edema is swelling that is caused by a buildup of fluid. Peripheral edema most often affects the lower legs, ankles, and feet. Move around often to prevent stiffness and to reduce swelling. Do not sit or stand for long periods of time. Pay attention to any changes in your symptoms. Contact a health care provider if you have edema that starts suddenly or is getting worse, especially if you are pregnant or have a medical condition. Get help right away if you develop shortness of breath, especially when lying down.   This information is not intended to replace advice given to you by your health care provider. Make sure you discuss any questions you have with your health care provider. Document Revised: 04/13/2021 Document Reviewed: 04/13/2021 Elsevier Patient Education  2023 Elsevier Inc.  

## 2022-02-22 NOTE — Progress Notes (Signed)
   Subjective:    Patient ID: Belinda Scott, female    DOB: 25-Jul-1941, 81 y.o.   MRN: [id number]   Chief Complaint: Edema   HPI Patient come sin today to discuss edema. She is a regular patient of C. Hawks,FNP and was last seen on 10/08/21. She is on norvasc and Lotensin Hct daily for blood pressure. Has had edema in bil feet for over a year. Seems to be worsening. The swelling will go down some at night.     Review of Systems  Constitutional:  Negative for diaphoresis.  Eyes:  Negative for pain.  Respiratory:  Negative for shortness of breath.   Cardiovascular:  Negative for chest pain, palpitations and leg swelling.  Gastrointestinal:  Negative for abdominal pain.  Endocrine: Negative for polydipsia.  Skin:  Negative for rash.  Neurological:  Negative for dizziness, weakness and headaches.  Hematological:  Does not bruise/bleed easily.  All other systems reviewed and are negative.      Objective:   Physical Exam Constitutional:      Appearance: Normal appearance.  Cardiovascular:     Rate and Rhythm: Normal rate and regular rhythm.     Heart sounds: Normal heart sounds.  Pulmonary:     Breath sounds: Rales (fine in left lower lobe) present.  Musculoskeletal:     Right lower leg: Edema (2+) present.     Left lower leg: Edema (1+) present.  Skin:    General: Skin is warm.  Neurological:     General: No focal deficit present.     Mental Status: She is alert and oriented to person, place, and time.  Psychiatric:        Mood and Affect: Mood normal.        Behavior: Behavior normal.   BP (!) 155/68   Pulse 74   Temp 98.6 F (37 C) (Temporal)   Resp 20   Ht $R'5\' 4"'iy$  (1.626 m)   Wt 162 lb (73.5 kg)   SpO2 96%   BMI 27.81 kg/m          Assessment & Plan:   Belinda Scott in today with chief complaint of Edema   1. Peripheral edema Added lasix Labs pending - Brain natriuretic peptide - BMP8+EGFR - furosemide (LASIX) 20 MG tablet; Take 1 tablet (20 mg  total) by mouth daily.  Dispense: 30 tablet; Refill: 3  2. Essential hypertension Stop lotrensin hct Added benazepril $RemoveBeforeDE'20mg'UrVGhxVKzKQdhwA$  daily - benazepril (LOTENSIN) 20 MG tablet; Take 1 tablet (20 mg total) by mouth daily.  Dispense: 90 tablet; Refill: 3    The above assessment and management plan was discussed with the patient. The patient verbalized understanding of and has agreed to the management plan. Patient is aware to call the clinic if symptoms persist or worsen. Patient is aware when to return to the clinic for a follow-up visit. Patient educated on when it is appropriate to go to the emergency department.   Mary-Margaret Hassell Done, FNP

## 2022-02-23 LAB — BRAIN NATRIURETIC PEPTIDE: BNP: 75.4 pg/mL (ref 0.0–100.0)

## 2022-02-23 LAB — BMP8+EGFR
BUN/Creatinine Ratio: 17 (ref 12–28)
BUN: 15 mg/dL (ref 8–27)
CO2: 24 mmol/L (ref 20–29)
Calcium: 9.9 mg/dL (ref 8.7–10.3)
Chloride: 101 mmol/L (ref 96–106)
Creatinine, Ser: 0.9 mg/dL (ref 0.57–1.00)
Glucose: 107 mg/dL — ABNORMAL HIGH (ref 70–99)
Potassium: 3.6 mmol/L (ref 3.5–5.2)
Sodium: 142 mmol/L (ref 134–144)
eGFR: 63 mL/min/{1.73_m2} (ref >59)

## 2022-03-11 DIAGNOSIS — M5137 Other intervertebral disc degeneration, lumbosacral region: Secondary | ICD-10-CM | POA: Diagnosis not present

## 2022-03-11 DIAGNOSIS — M9901 Segmental and somatic dysfunction of cervical region: Secondary | ICD-10-CM | POA: Diagnosis not present

## 2022-03-11 DIAGNOSIS — M9904 Segmental and somatic dysfunction of sacral region: Secondary | ICD-10-CM | POA: Diagnosis not present

## 2022-03-11 DIAGNOSIS — M9903 Segmental and somatic dysfunction of lumbar region: Secondary | ICD-10-CM | POA: Diagnosis not present

## 2022-03-25 ENCOUNTER — Other Ambulatory Visit: Payer: Self-pay | Admitting: Family

## 2022-03-25 DIAGNOSIS — E785 Hyperlipidemia, unspecified: Secondary | ICD-10-CM

## 2022-04-15 DIAGNOSIS — M5137 Other intervertebral disc degeneration, lumbosacral region: Secondary | ICD-10-CM | POA: Diagnosis not present

## 2022-04-15 DIAGNOSIS — M9901 Segmental and somatic dysfunction of cervical region: Secondary | ICD-10-CM | POA: Diagnosis not present

## 2022-04-15 DIAGNOSIS — M9904 Segmental and somatic dysfunction of sacral region: Secondary | ICD-10-CM | POA: Diagnosis not present

## 2022-04-15 DIAGNOSIS — M9903 Segmental and somatic dysfunction of lumbar region: Secondary | ICD-10-CM | POA: Diagnosis not present

## 2022-05-12 ENCOUNTER — Ambulatory Visit (INDEPENDENT_AMBULATORY_CARE_PROVIDER_SITE_OTHER): Payer: Medicare Other

## 2022-05-12 DIAGNOSIS — Z23 Encounter for immunization: Secondary | ICD-10-CM | POA: Diagnosis not present

## 2022-05-20 DIAGNOSIS — M5137 Other intervertebral disc degeneration, lumbosacral region: Secondary | ICD-10-CM | POA: Diagnosis not present

## 2022-05-20 DIAGNOSIS — M9903 Segmental and somatic dysfunction of lumbar region: Secondary | ICD-10-CM | POA: Diagnosis not present

## 2022-05-20 DIAGNOSIS — M9901 Segmental and somatic dysfunction of cervical region: Secondary | ICD-10-CM | POA: Diagnosis not present

## 2022-05-20 DIAGNOSIS — M9904 Segmental and somatic dysfunction of sacral region: Secondary | ICD-10-CM | POA: Diagnosis not present

## 2022-06-07 DIAGNOSIS — M9903 Segmental and somatic dysfunction of lumbar region: Secondary | ICD-10-CM | POA: Diagnosis not present

## 2022-06-07 DIAGNOSIS — M5137 Other intervertebral disc degeneration, lumbosacral region: Secondary | ICD-10-CM | POA: Diagnosis not present

## 2022-06-07 DIAGNOSIS — M9904 Segmental and somatic dysfunction of sacral region: Secondary | ICD-10-CM | POA: Diagnosis not present

## 2022-06-07 DIAGNOSIS — M9901 Segmental and somatic dysfunction of cervical region: Secondary | ICD-10-CM | POA: Diagnosis not present

## 2022-06-29 DIAGNOSIS — M9904 Segmental and somatic dysfunction of sacral region: Secondary | ICD-10-CM | POA: Diagnosis not present

## 2022-06-29 DIAGNOSIS — M9903 Segmental and somatic dysfunction of lumbar region: Secondary | ICD-10-CM | POA: Diagnosis not present

## 2022-06-29 DIAGNOSIS — M9901 Segmental and somatic dysfunction of cervical region: Secondary | ICD-10-CM | POA: Diagnosis not present

## 2022-06-29 DIAGNOSIS — M5137 Other intervertebral disc degeneration, lumbosacral region: Secondary | ICD-10-CM | POA: Diagnosis not present

## 2022-06-30 DIAGNOSIS — E042 Nontoxic multinodular goiter: Secondary | ICD-10-CM | POA: Diagnosis not present

## 2022-06-30 DIAGNOSIS — G44209 Tension-type headache, unspecified, not intractable: Secondary | ICD-10-CM | POA: Diagnosis not present

## 2022-06-30 DIAGNOSIS — K090 Developmental odontogenic cysts: Secondary | ICD-10-CM | POA: Diagnosis not present

## 2022-06-30 DIAGNOSIS — J341 Cyst and mucocele of nose and nasal sinus: Secondary | ICD-10-CM | POA: Diagnosis not present

## 2022-06-30 DIAGNOSIS — E785 Hyperlipidemia, unspecified: Secondary | ICD-10-CM | POA: Diagnosis not present

## 2022-06-30 DIAGNOSIS — E041 Nontoxic single thyroid nodule: Secondary | ICD-10-CM | POA: Diagnosis not present

## 2022-06-30 DIAGNOSIS — R519 Headache, unspecified: Secondary | ICD-10-CM | POA: Diagnosis not present

## 2022-06-30 DIAGNOSIS — K048 Radicular cyst: Secondary | ICD-10-CM | POA: Diagnosis not present

## 2022-06-30 DIAGNOSIS — I10 Essential (primary) hypertension: Secondary | ICD-10-CM | POA: Diagnosis not present

## 2022-07-20 DIAGNOSIS — M9904 Segmental and somatic dysfunction of sacral region: Secondary | ICD-10-CM | POA: Diagnosis not present

## 2022-07-20 DIAGNOSIS — M5137 Other intervertebral disc degeneration, lumbosacral region: Secondary | ICD-10-CM | POA: Diagnosis not present

## 2022-07-20 DIAGNOSIS — M9903 Segmental and somatic dysfunction of lumbar region: Secondary | ICD-10-CM | POA: Diagnosis not present

## 2022-07-20 DIAGNOSIS — M9901 Segmental and somatic dysfunction of cervical region: Secondary | ICD-10-CM | POA: Diagnosis not present

## 2022-07-22 ENCOUNTER — Telehealth: Payer: Self-pay

## 2022-07-22 ENCOUNTER — Other Ambulatory Visit (HOSPITAL_COMMUNITY): Payer: Self-pay

## 2022-07-22 ENCOUNTER — Encounter: Payer: Self-pay | Admitting: Family

## 2022-07-22 ENCOUNTER — Ambulatory Visit (INDEPENDENT_AMBULATORY_CARE_PROVIDER_SITE_OTHER): Payer: Medicare Other | Admitting: Family

## 2022-07-22 VITALS — BP 155/79 | HR 77 | Temp 98.0°F | Ht 64.0 in | Wt 158.0 lb

## 2022-07-22 DIAGNOSIS — M545 Low back pain, unspecified: Secondary | ICD-10-CM

## 2022-07-22 DIAGNOSIS — I10 Essential (primary) hypertension: Secondary | ICD-10-CM | POA: Diagnosis not present

## 2022-07-22 DIAGNOSIS — K2100 Gastro-esophageal reflux disease with esophagitis, without bleeding: Secondary | ICD-10-CM | POA: Diagnosis not present

## 2022-07-22 DIAGNOSIS — Z09 Encounter for follow-up examination after completed treatment for conditions other than malignant neoplasm: Secondary | ICD-10-CM

## 2022-07-22 DIAGNOSIS — Z0001 Encounter for general adult medical examination with abnormal findings: Secondary | ICD-10-CM

## 2022-07-22 DIAGNOSIS — E041 Nontoxic single thyroid nodule: Secondary | ICD-10-CM | POA: Diagnosis not present

## 2022-07-22 DIAGNOSIS — Z0000 Encounter for general adult medical examination without abnormal findings: Secondary | ICD-10-CM

## 2022-07-22 DIAGNOSIS — M25511 Pain in right shoulder: Secondary | ICD-10-CM

## 2022-07-22 DIAGNOSIS — M159 Polyosteoarthritis, unspecified: Secondary | ICD-10-CM

## 2022-07-22 DIAGNOSIS — E663 Overweight: Secondary | ICD-10-CM

## 2022-07-22 DIAGNOSIS — E785 Hyperlipidemia, unspecified: Secondary | ICD-10-CM

## 2022-07-22 DIAGNOSIS — E559 Vitamin D deficiency, unspecified: Secondary | ICD-10-CM

## 2022-07-22 DIAGNOSIS — F411 Generalized anxiety disorder: Secondary | ICD-10-CM

## 2022-07-22 MED ORDER — BENAZEPRIL HCL 40 MG PO TABS: 40.0000 mg | ORAL_TABLET | Freq: Every day | ORAL | 3 refills | Status: DC

## 2022-07-22 MED ORDER — AMLODIPINE BESYLATE 5 MG PO TABS
5.0000 mg | ORAL_TABLET | Freq: Every day | ORAL | 2 refills | Status: DC
Start: 2022-07-22 — End: 2023-09-14

## 2022-07-22 MED ORDER — AMOXICILLIN-POT CLAVULANATE 875-125 MG PO TABS: 1.0000 | ORAL_TABLET | Freq: Two times a day (BID) | ORAL | 0 refills | Status: DC

## 2022-07-22 MED ORDER — BUTALBITAL-APAP-CAFFEINE 50-300-40 MG PO CAPS: 1.0000 | ORAL_CAPSULE | ORAL | 1 refills | Status: DC | PRN

## 2022-07-22 MED ORDER — ONDANSETRON 4 MG PO TBDP: ORAL_TABLET | ORAL | 2 refills | Status: DC

## 2022-07-22 NOTE — Patient Instructions (Signed)
Osteoarthritis  Osteoarthritis is a type of arthritis. It refers to joint pain or joint disease. Osteoarthritis affects tissue that covers the ends of bones in joints (cartilage). Cartilage acts as a cushion between the bones and helps them move smoothly. Osteoarthritis occurs when cartilage in the joints gets worn down. Osteoarthritis is sometimes called "wear and tear" arthritis. Osteoarthritis is the most common form of arthritis. It often occurs in older people. It is a condition that gets worse over time. The joints most often affected by this condition are in the fingers, toes, hips, knees, and spine, including the neck and lower back. What are the causes? This condition is caused by the wearing down of cartilage that covers the ends of bones. What increases the risk? The following factors may make you more likely to develop this condition: Being age 50 or older. Obesity. Overuse of joints. Past injury of a joint. Past surgery on a joint. Family history of osteoarthritis. What are the signs or symptoms? The main symptoms of this condition are pain, swelling, and stiffness in the joint. Other symptoms may include: An enlarged joint. More pain and further damage caused by small pieces of bone or cartilage that break off and float inside of the joint. Small deposits of bone (osteophytes) that grow on the edges of the joint. A grating or scraping feeling inside the joint when you move it. Popping or creaking sounds when you move. Difficulty walking or exercising. An inability to grip items, twist your hand(s), or control the movements of your hands and fingers. How is this diagnosed? This condition may be diagnosed based on: Your medical history. A physical exam. Your symptoms. X-rays of the affected joint(s). Blood tests to rule out other types of arthritis. How is this treated? There is no cure for this condition, but treatment can help control pain and improve joint function.  Treatment may include a combination of therapies, such as: Pain relief techniques, such as: Applying heat and cold to the joint. Massage. A form of talk therapy called cognitive behavioral therapy (CBT). This therapy helps you set goals and follow up on the changes that you make. Medicines for pain and inflammation. The medicines can be taken by mouth or applied to the skin. They include: NSAIDs, such as ibuprofen. Prescription medicines. Strong anti-inflammatory medicines (corticosteroids). Certain nutritional supplements. A prescribed exercise program. You may work with a physical therapist. Assistive devices, such as a brace, wrap, splint, specialized glove, or cane. A weight control plan. Surgery, such as: An osteotomy. This is done to reposition the bones and relieve pain or to remove loose pieces of bone and cartilage. Joint replacement surgery. You may need this surgery if you have advanced osteoarthritis. Follow these instructions at home: Activity Rest your affected joints as told by your health care provider. Exercise as told by your health care provider. He or she may recommend specific types of exercise, such as: Strengthening exercises. These are done to strengthen the muscles that support joints affected by arthritis. Aerobic activities. These are exercises, such as brisk walking or water aerobics, that increase your heart rate. Range-of-motion activities. These help your joints move more easily. Balance and agility exercises. Managing pain, stiffness, and swelling     If directed, apply heat to the affected area as often as told by your health care provider. Use the heat source that your health care provider recommends, such as a moist heat pack or a heating pad. If you have a removable assistive device, remove it   as told by your health care provider. Place a towel between your skin and the heat source. If your health care provider tells you to keep the assistive device  on while you apply heat, place a towel between the assistive device and the heat source. Leave the heat on for 20-30 minutes. Remove the heat if your skin turns bright red. This is especially important if you are unable to feel pain, heat, or cold. You may have a greater risk of getting burned. If directed, put ice on the affected area. To do this: If you have a removable assistive device, remove it as told by your health care provider. Put ice in a plastic bag. Place a towel between your skin and the bag. If your health care provider tells you to keep the assistive device on during icing, place a towel between the assistive device and the bag. Leave the ice on for 20 minutes, 2-3 times a day. Move your fingers or toes often to reduce stiffness and swelling. Raise (elevate) the injured area above the level of your heart while you are sitting or lying down. General instructions Take over-the-counter and prescription medicines only as told by your health care provider. Maintain a healthy weight. Follow instructions from your health care provider for weight control. Do not use any products that contain nicotine or tobacco, such as cigarettes, e-cigarettes, and chewing tobacco. If you need help quitting, ask your health care provider. Use assistive devices as told by your health care provider. Keep all follow-up visits as told by your health care provider. This is important. Where to find more information National Institute of Arthritis and Musculoskeletal and Skin Diseases: www.niams.nih.gov National Institute on Aging: www.nia.nih.gov American College of Rheumatology: www.rheumatology.org Contact a health care provider if: You have redness, swelling, or a feeling of warmth in a joint that gets worse. You have a fever along with joint or muscle aches. You develop a rash. You have trouble doing your normal activities. Get help right away if: You have pain that gets worse and is not relieved by  pain medicine. Summary Osteoarthritis is a type of arthritis that affects tissue covering the ends of bones in joints (cartilage). This condition is caused by the wearing down of cartilage that covers the ends of bones. The main symptom of this condition is pain, swelling, and stiffness in the joint. There is no cure for this condition, but treatment can help control pain and improve joint function. This information is not intended to replace advice given to you by your health care provider. Make sure you discuss any questions you have with your health care provider. Document Revised: 02/09/2022 Document Reviewed: 08/06/2019 Elsevier Patient Education  2023 Elsevier Inc.  

## 2022-07-22 NOTE — Addendum Note (Signed)
Addended by: Evelina Dun A on: 07/22/2022 01:03 PM   Modules accepted: Orders

## 2022-07-22 NOTE — Progress Notes (Addendum)
Subjective:    Patient ID: Belinda Scott, female    DOB: 06-10-1941, 81 y.o.   MRN: [id number]  Chief Complaint  Patient presents with   ER follow up   PT presents to the office today for CPE and chronic follow up. She reports she can not take Norco  because it causes stomach pains. She is currently taking sulindac prescription that helps.    She also went to the ED on 06/30/22 for headache. She had a CTA head and Neck that showed, "1. No acute intracranial abnormality.  2. No intracranial large vessel occlusion or significant stenosis.  No hemodynamically significant stenosis in the neck.  3. Likely a 2.8 x 2.5 x 3.0 cm dentigerous cyst centered around an  unerupted tooth along the left maxillary arch.  4. Multinodular thyroid gland with a dominant thyroid nodule  measuring up to 3.2 cm on the right. Recommend further evaluation  with a thyroid ultrasound, if not previously performed. " Hypertension This is a chronic problem. The current episode started more than 1 year ago. The problem is uncontrolled. Associated symptoms include anxiety, malaise/fatigue and peripheral edema. Pertinent negatives include no shortness of breath. Risk factors for coronary artery disease include dyslipidemia, obesity and sedentary lifestyle. The current treatment provides moderate improvement. There is no history of heart failure.  Arthritis Presents for follow-up visit. She complains of pain and stiffness. The symptoms have been stable. Affected locations include the right shoulder, left knee, right knee and neck. Her pain is at a severity of 10/10.  Hyperlipidemia This is a chronic problem. The current episode started more than 1 year ago. The problem is controlled. Recent lipid tests were reviewed and are normal. Exacerbating diseases include obesity. Pertinent negatives include no shortness of breath. Current antihyperlipidemic treatment includes statins. The current treatment provides moderate  improvement of lipids. Risk factors for coronary artery disease include dyslipidemia, hypertension, a sedentary lifestyle and post-menopausal.  Anxiety Presents for follow-up visit. Symptoms include depressed mood, excessive worry, irritability and nervous/anxious behavior. Patient reports no shortness of breath. Symptoms occur occasionally. The severity of symptoms is mild.    Back Pain This is a chronic problem. The current episode started more than 1 year ago. The problem occurs intermittently. The pain is present in the lumbar spine. The quality of the pain is described as aching. The pain is at a severity of 8/10. The pain is moderate. Treatments tried: tylenol.      Review of Systems  Constitutional:  Positive for irritability and malaise/fatigue.  Respiratory:  Negative for shortness of breath.   Musculoskeletal:  Positive for arthritis, back pain and stiffness.  Psychiatric/Behavioral:  The patient is nervous/anxious.   All other systems reviewed and are negative.  Family History  Problem Relation Age of Onset   Dementia Mother    Cancer Father    Social History   Socioeconomic History   Marital status: Married    Spouse name: Jose   Number of children: 3   Years of education: Not on file   Highest education level: Not on file  Occupational History   Occupation: retired  Tobacco Use   Smoking status: Never   Smokeless tobacco: Never  Substance and Sexual Activity   Alcohol use: No   Drug use: No   Sexual activity: Not on file  Other Topics Concern   Not on file  Social History Narrative   Lives home with her husband on one level   Her children live a  couple of hours away   Social Determinants of Health   Financial Resource Strain: Low Risk  (06/01/2021)   Overall Financial Resource Strain (CARDIA)    Difficulty of Paying Living Expenses: Not hard at all  Food Insecurity: No Food Insecurity (06/01/2021)   Hunger Vital Sign    Worried About Running Out of  Food in the Last Year: Never true    Ran Out of Food in the Last Year: Never true  Transportation Needs: No Transportation Needs (06/01/2021)   PRAPARE - Hydrologist (Medical): No    Lack of Transportation (Non-Medical): No  Physical Activity: Insufficiently Active (06/01/2021)   Exercise Vital Sign    Days of Exercise per Week: 7 days    Minutes of Exercise per Session: 20 min  Stress: No Stress Concern Present (06/01/2021)   [place]    Feeling of Stress : Only a little  Social Connections: Moderately Integrated (06/01/2021)   Social Connection and Isolation Panel [NHANES]    Frequency of Communication with Friends and Family: More than three times a week    Frequency of Social Gatherings with Friends and Family: Three times a week    Attends Religious Services: More than 4 times per year    Active Member of Clubs or Organizations: No    Attends Archivist Meetings: Never    Marital Status: Married        Objective:   Physical Exam Vitals reviewed.  Constitutional:      General: She is not in acute distress.    Appearance: She is well-developed.  HENT:     Head: Normocephalic and atraumatic.     Right Ear: Tympanic membrane normal.     Left Ear: Tympanic membrane normal.  Eyes:     Pupils: Pupils are equal, round, and reactive to light.  Neck:     Thyroid: No thyromegaly.  Cardiovascular:     Rate and Rhythm: Normal rate and regular rhythm.     Heart sounds: Normal heart sounds. No murmur heard. Pulmonary:     Effort: Pulmonary effort is normal. No respiratory distress.     Breath sounds: Normal breath sounds. No wheezing.  Abdominal:     General: Bowel sounds are normal. There is no distension.     Palpations: Abdomen is soft.     Tenderness: There is no abdominal tenderness.  Musculoskeletal:        General: No tenderness. Normal range of motion.      Cervical back: Normal range of motion and neck supple.     Comments: Pain in right shoulder with abduction   Skin:    General: Skin is warm and dry.  Neurological:     Mental Status: She is alert and oriented to person, place, and time.     Cranial Nerves: No cranial nerve deficit.     Deep Tendon Reflexes: Reflexes are normal and symmetric.  Psychiatric:        Behavior: Behavior normal.        Thought Content: Thought content normal.        Judgment: Judgment normal.     rightshoulder prepped with betadine Injected with lidocaine 2% with and methylprednisolone with 22 guage needle x 1. Patient tolerated well.   BP (!) 155/79   Pulse 77   Temp 98 F (36.7 C) (Temporal)   Ht _0  (1.626 m)   Wt 158 lb (71.7 kg)  SpO2 96%   BMI 27.12 kg/m      Assessment & Plan:  ARLYSS WEATHERSBY comes in today with chief complaint of ER follow up   Diagnosis and orders addressed:  1. Essential hypertension Will increase Benazepril to 40 mg from 20 mg  -Daily blood pressure log given with instructions on how to fill out and told to bring to next visit -Dash diet information given -Exercise encouraged - Stress Management  -Continue current meds -RTO in 2 weeks  - amLODipine (NORVASC) 5 MG tablet; Take 1 tablet (5 mg total) by mouth daily.  Dispense: 90 tablet; Refill: 2 - benazepril (LOTENSIN) 40 MG tablet; Take 1 tablet (40 mg total) by mouth daily.  Dispense: 90 tablet; Refill: 3 - CMP14+EGFR; Future - CBC with Differential/Platelet; Future  2. Gastroesophageal reflux disease - ondansetron (ZOFRAN-ODT) 4 MG disintegrating tablet; DISSOLVE 1 TABLET IN MOUTH EVERY 8 HOURS AS NEEDED FOR NAUSEA FOR VOMITING  Dispense: 40 tablet; Refill: 2 - CMP14+EGFR; Future - CBC with Differential/Platelet; Future  3. Annual physical exam  - CMP14+EGFR; Future - CBC with Differential/Platelet; Future - Lipid panel; Future - TSH; Future - VITAMIN D 25 Hydroxy (Vit-D Deficiency, Fractures);  Future  4. Hospital discharge follow-up - CMP14+EGFR; Future - CBC with Differential/Platelet; Future  5. Thyroid nodule - CMP14+EGFR; Future - CBC with Differential/Platelet; Future - TSH; Future  6. Vitamin D deficiency - CMP14+EGFR; Future - CBC with Differential/Platelet; Future - VITAMIN D 25 Hydroxy (Vit-D Deficiency, Fractures); Future  7. Overweight (BMI 25.0-29.9) - CMP14+EGFR; Future - CBC with Differential/Platelet; Future  8. Osteoarthritis of multiple joints, unspecified osteoarthritis type - CMP14+EGFR; Future - CBC with Differential/Platelet; Future  9. Hyperlipidemia, unspecified hyperlipidemia type - CMP14+EGFR; Future - CBC with Differential/Platelet; Future - Lipid panel; Future  10. Chronic bilateral low back pain, unspecified whether sciatica present - CMP14+EGFR; Future - CBC with Differential/Platelet; Future  11. GAD (generalized anxiety disorder) - CMP14+EGFR; Future - CBC with Differential/Platelet; Future    Labs pending Health Maintenance reviewed Diet and exercise encouraged  Follow up plan: 2 weeks    Evelina Dun, FNP

## 2022-07-22 NOTE — Telephone Encounter (Signed)
Pharmacy Patient Advocate Encounter   Received notification from [employer] that prior authorization for Butalbital-APAP-Caffeine 50-300-'40MG'$  capsules is required/requested.  Per Test Claim: DICLOFEN POT TAB '50MG'$ ;IBUPROFEN;NAPROXEN;KETOPROFEN CAP '50MG'$  PREF`D   PA not submitted. Awaiting more information    [facility]

## 2022-07-26 MED ORDER — NAPROXEN 500 MG PO TABS: 500.0000 mg | ORAL_TABLET | Freq: Two times a day (BID) | ORAL | 0 refills | Status: DC

## 2022-07-26 NOTE — Telephone Encounter (Signed)
Patient aware and verbalized understanding. °

## 2022-07-26 NOTE — Telephone Encounter (Signed)
Diclofenac changed to naproxen per insurance

## 2022-08-17 ENCOUNTER — Other Ambulatory Visit: Payer: Self-pay | Admitting: Family

## 2022-08-17 DIAGNOSIS — E785 Hyperlipidemia, unspecified: Secondary | ICD-10-CM

## 2022-08-19 DIAGNOSIS — R059 Cough, unspecified: Secondary | ICD-10-CM | POA: Diagnosis not present

## 2022-08-19 DIAGNOSIS — R0981 Nasal congestion: Secondary | ICD-10-CM | POA: Diagnosis not present

## 2022-08-19 DIAGNOSIS — G4452 New daily persistent headache (NDPH): Secondary | ICD-10-CM | POA: Diagnosis not present

## 2022-08-19 DIAGNOSIS — J329 Chronic sinusitis, unspecified: Secondary | ICD-10-CM | POA: Diagnosis not present

## 2022-08-19 DIAGNOSIS — R509 Fever, unspecified: Secondary | ICD-10-CM | POA: Diagnosis not present

## 2022-09-02 ENCOUNTER — Ambulatory Visit (INDEPENDENT_AMBULATORY_CARE_PROVIDER_SITE_OTHER): Payer: Medicare Other | Admitting: Family

## 2022-09-02 ENCOUNTER — Encounter: Payer: Self-pay | Admitting: Family

## 2022-09-02 VITALS — BP 156/76 | HR 69 | Temp 97.1°F | Ht 64.0 in | Wt 155.8 lb

## 2022-09-02 DIAGNOSIS — E785 Hyperlipidemia, unspecified: Secondary | ICD-10-CM

## 2022-09-02 DIAGNOSIS — E559 Vitamin D deficiency, unspecified: Secondary | ICD-10-CM

## 2022-09-02 DIAGNOSIS — M159 Polyosteoarthritis, unspecified: Secondary | ICD-10-CM

## 2022-09-02 DIAGNOSIS — E041 Nontoxic single thyroid nodule: Secondary | ICD-10-CM

## 2022-09-02 DIAGNOSIS — E663 Overweight: Secondary | ICD-10-CM

## 2022-09-02 DIAGNOSIS — G8929 Other chronic pain: Secondary | ICD-10-CM

## 2022-09-02 DIAGNOSIS — M8580 Other specified disorders of bone density and structure, unspecified site: Secondary | ICD-10-CM

## 2022-09-02 DIAGNOSIS — Z6826 Body mass index (BMI) 26.0-26.9, adult: Secondary | ICD-10-CM

## 2022-09-02 DIAGNOSIS — I10 Essential (primary) hypertension: Secondary | ICD-10-CM | POA: Diagnosis not present

## 2022-09-02 DIAGNOSIS — M545 Low back pain, unspecified: Secondary | ICD-10-CM | POA: Diagnosis not present

## 2022-09-02 DIAGNOSIS — F411 Generalized anxiety disorder: Secondary | ICD-10-CM

## 2022-09-02 NOTE — Patient Instructions (Signed)
Health Maintenance After Age 84 After age 84, you are at a higher risk for certain long-term diseases and infections as well as injuries from falls. Falls are a major cause of broken bones and head injuries in people who are older than age 84. Getting regular preventive care can help to keep you healthy and well. Preventive care includes getting regular testing and making lifestyle changes as recommended by your health care provider. Talk with your health care provider about: Which screenings and tests you should have. A screening is a test that checks for a disease when you have no symptoms. A diet and exercise plan that is right for you. What should I know about screenings and tests to prevent falls? Screening and testing are the best ways to find a health problem early. Early diagnosis and treatment give you the best chance of managing medical conditions that are common after age 84. Certain conditions and lifestyle choices may make you more likely to have a fall. Your health care provider may recommend: Regular vision checks. Poor vision and conditions such as cataracts can make you more likely to have a fall. If you wear glasses, make sure to get your prescription updated if your vision changes. Medicine review. Work with your health care provider to regularly review all of the medicines you are taking, including over-the-counter medicines. Ask your health care provider about any side effects that may make you more likely to have a fall. Tell your health care provider if any medicines that you take make you feel dizzy or sleepy. Strength and balance checks. Your health care provider may recommend certain tests to check your strength and balance while standing, walking, or changing positions. Foot health exam. Foot pain and numbness, as well as not wearing proper footwear, can make you more likely to have a fall. Screenings, including: Osteoporosis screening. Osteoporosis is a condition that causes  the bones to get weaker and break more easily. Blood pressure screening. Blood pressure changes and medicines to control blood pressure can make you feel dizzy. Depression screening. You may be more likely to have a fall if you have a fear of falling, feel depressed, or feel unable to do activities that you used to do. Alcohol use screening. Using too much alcohol can affect your balance and may make you more likely to have a fall. Follow these instructions at home: Lifestyle Do not drink alcohol if: Your health care provider tells you not to drink. If you drink alcohol: Limit how much you have to: 0-1 drink a day for women. 0-2 drinks a day for men. Know how much alcohol is in your drink. In the [place] one drink equals one 12 oz bottle of beer (355 mL), one 5 oz glass of wine (148 mL), or one 1 oz glass of hard liquor (44 mL). Do not use any products that contain nicotine or tobacco. These products include cigarettes, chewing tobacco, and vaping devices, such as e-cigarettes. If you need help quitting, ask your health care provider. Activity  Follow a regular exercise program to stay fit. This will help you maintain your balance. Ask your health care provider what types of exercise are appropriate for you. If you need a cane or walker, use it as recommended by your health care provider. Wear supportive shoes that have nonskid soles. Safety  Remove any tripping hazards, such as rugs, cords, and clutter. Install safety equipment such as grab bars in bathrooms and safety rails on stairs. Keep rooms and walkways   well-lit. General instructions Talk with your health care provider about your risks for falling. Tell your health care provider if: You fall. Be sure to tell your health care provider about all falls, even ones that seem minor. You feel dizzy, tiredness (fatigue), or off-balance. Take over-the-counter and prescription medicines only as told by your health care provider. These include  supplements. Eat a healthy diet and maintain a healthy weight. A healthy diet includes low-fat dairy products, low-fat (lean) meats, and fiber from whole grains, beans, and lots of fruits and vegetables. Stay current with your vaccines. Schedule regular health, dental, and eye exams. Summary Having a healthy lifestyle and getting preventive care can help to protect your health and wellness after age 84. Screening and testing are the best way to find a health problem early and help you avoid having a fall. Early diagnosis and treatment give you the best chance for managing medical conditions that are more common for people who are older than age 84. Falls are a major cause of broken bones and head injuries in people who are older than age 84. Take precautions to prevent a fall at home. Work with your health care provider to learn what changes you can make to improve your health and wellness and to prevent falls. This information is not intended to replace advice given to you by your health care provider. Make sure you discuss any questions you have with your health care provider. Document Revised: 12/29/2020 Document Reviewed: 12/29/2020 Elsevier Patient Education  2023 [employer]  

## 2022-09-02 NOTE — Progress Notes (Signed)
Subjective:    Patient ID: Belinda Scott, female    DOB: [birth year], 82 y.o.   MRN: [id number]  Chief Complaint  Patient presents with   Medical Management of Chronic Issues   Sinus Problem   PT presents to the office today for chronic follow up. She reports she can not take Norco  because it causes stomach pains. She is currently taking sulindac prescription that helps.     She also went to the ED on 06/30/22 for headache. She had a CTA head and Neck that showed, "1. No acute intracranial abnormality.  2. No intracranial large vessel occlusion or significant stenosis.  No hemodynamically significant stenosis in the neck.  3. Likely a 2.8 x 2.5 x 3.0 cm dentigerous cyst centered around an  unerupted tooth along the left maxillary arch.  4. Multinodular thyroid gland with a dominant thyroid nodule  measuring up to 3.2 cm on the right. Recommend further evaluation  with a thyroid ultrasound, if not previously performed. "  However, wants to wait on the thyroid ultrasound.  Sinus Problem This is a new problem. The current episode started 1 to 4 weeks ago. The problem has been gradually improving since onset. The pain is mild. Associated symptoms include congestion, coughing, sinus pressure and sneezing. Pertinent negatives include no shortness of breath. Past treatments include antibiotics. The treatment provided moderate relief.  Hypertension This is a chronic problem. The current episode started more than 1 year ago. The problem has been waxing and waning since onset. The problem is uncontrolled. Associated symptoms include anxiety, malaise/fatigue and peripheral edema. Pertinent negatives include no shortness of breath. Risk factors for coronary artery disease include dyslipidemia and sedentary lifestyle. The current treatment provides moderate improvement.  Arthritis Presents for follow-up visit. She complains of pain and stiffness. The symptoms have been stable. Affected locations  include the left knee, right knee, left MCP and right MCP. Her pain is at a severity of 6/10.  Hyperlipidemia This is a chronic problem. The current episode started more than 1 year ago. The problem is controlled. Recent lipid tests were reviewed and are normal. Pertinent negatives include no shortness of breath. Current antihyperlipidemic treatment includes statins. The current treatment provides moderate improvement of lipids. Risk factors for coronary artery disease include dyslipidemia, hypertension, a sedentary lifestyle and post-menopausal.  Anxiety Presents for follow-up visit. Symptoms include excessive worry, irritability and nervous/anxious behavior. Patient reports no shortness of breath. Symptoms occur occasionally. The severity of symptoms is mild.    Back Pain This is a chronic problem. The current episode started more than 1 year ago. The problem occurs intermittently. The problem has been waxing and waning since onset. The pain is present in the lumbar spine. The quality of the pain is described as aching. The pain is at a severity of 8/10. The pain is moderate. She has tried bed rest for the symptoms. The treatment provided mild relief.      Review of Systems  Constitutional:  Positive for irritability and malaise/fatigue.  HENT:  Positive for congestion, sinus pressure and sneezing.   Respiratory:  Positive for cough. Negative for shortness of breath.   Musculoskeletal:  Positive for arthritis, back pain and stiffness.  Psychiatric/Behavioral:  The patient is nervous/anxious.   All other systems reviewed and are negative.      Objective:   Physical Exam Vitals reviewed.  Constitutional:      General: She is not in acute distress.    Appearance: She is well-developed.  HENT:     Head: Normocephalic and atraumatic.     Right Ear: Tympanic membrane normal.     Left Ear: Tympanic membrane normal.  Eyes:     Pupils: Pupils are equal, round, and reactive to light.   Neck:     Thyroid: No thyromegaly.  Cardiovascular:     Rate and Rhythm: Normal rate and regular rhythm.     Heart sounds: Normal heart sounds. No murmur heard. Pulmonary:     Effort: Pulmonary effort is normal. No respiratory distress.     Breath sounds: Normal breath sounds. No wheezing.  Abdominal:     General: Bowel sounds are normal. There is no distension.     Palpations: Abdomen is soft.     Tenderness: There is no abdominal tenderness.  Musculoskeletal:        General: No tenderness.     Cervical back: Normal range of motion and neck supple.     Comments: Pain in lumbar with flexion and extension  Skin:    General: Skin is warm and dry.  Neurological:     Mental Status: She is alert and oriented to person, place, and time.     Cranial Nerves: No cranial nerve deficit.     Deep Tendon Reflexes: Reflexes are normal and symmetric.  Psychiatric:        Behavior: Behavior normal.        Thought Content: Thought content normal.        Judgment: Judgment normal.       BP (!) 156/76   Pulse 69   Temp (!) 97.1 F (36.2 C) (Temporal)   Ht '5\' 4"'$  (1.626 m)   Wt 155 lb 12.8 oz (70.7 kg)   SpO2 95%   BMI 26.74 kg/m '    Assessment & Plan:  AVAYAH RAFFETY comes in today with chief complaint of Medical Management of Chronic Issues and Sinus Problem   Diagnosis and orders addressed:  1. Essential hypertension - CMP14+EGFR - CBC with Differential/Platelet  2. GAD (generalized anxiety disorder) - CMP14+EGFR - CBC with Differential/Platelet  3. Hyperlipidemia, unspecified hyperlipidemia type - CMP14+EGFR - CBC with Differential/Platelet - Lipid panel  4. Osteoarthritis of multiple joints, unspecified osteoarthritis type - CMP14+EGFR - CBC with Differential/Platelet  5. Overweight (BMI 25.0-29.9)  - CMP14+EGFR - CBC with Differential/Platelet  6. Vitamin D deficiency - CMP14+EGFR - CBC with Differential/Platelet  7. Chronic bilateral low back pain,  unspecified whether sciatica present - CMP14+EGFR - CBC with Differential/Platelet  8. Osteopenia, unspecified location - CMP14+EGFR - CBC with Differential/Platelet  9. Thyroid nodule  - CMP14+EGFR - CBC with Differential/Platelet - TSH   Labs pending Health Maintenance reviewed Diet and exercise encouraged  Follow up plan: 6 months    Evelina Dun, FNP

## 2022-09-03 LAB — CBC WITH DIFFERENTIAL/PLATELET
Basophils Absolute: 0.1 10*3/uL (ref 0.0–0.2)
Basos: 1 %
EOS (ABSOLUTE): 0.2 10*3/uL (ref 0.0–0.4)
Eos: 2 %
Hematocrit: 45.6 % (ref 34.0–46.6)
Hemoglobin: 15.4 g/dL (ref 11.1–15.9)
Immature Grans (Abs): 0 10*3/uL (ref 0.0–0.1)
Immature Granulocytes: 0 %
Lymphocytes Absolute: 3.2 10*3/uL — ABNORMAL HIGH (ref 0.7–3.1)
Lymphs: 44 %
MCH: 30.4 pg (ref 26.6–33.0)
MCHC: 33.8 g/dL (ref 31.5–35.7)
MCV: 90 fL (ref 79–97)
Monocytes Absolute: 0.5 10*3/uL (ref 0.1–0.9)
Monocytes: 7 %
Neutrophils Absolute: 3.3 10*3/uL (ref 1.4–7.0)
Neutrophils: 46 %
Platelets: 220 10*3/uL (ref 150–450)
RBC: 5.06 x10E6/uL (ref 3.77–5.28)
RDW: 12.7 % (ref 11.7–15.4)
WBC: 7.3 10*3/uL (ref 3.4–10.8)

## 2022-09-03 LAB — CMP14+EGFR
ALT: 21 IU/L (ref 0–32)
AST: 23 IU/L (ref 0–40)
Albumin/Globulin Ratio: 1.6 (ref 1.2–2.2)
Albumin: 4.6 g/dL (ref 3.7–4.7)
Alkaline Phosphatase: 115 IU/L (ref 44–121)
BUN/Creatinine Ratio: 16 (ref 12–28)
BUN: 13 mg/dL (ref 8–27)
Bilirubin Total: 0.6 mg/dL (ref 0.0–1.2)
CO2: 26 mmol/L (ref 20–29)
Calcium: 9.7 mg/dL (ref 8.7–10.3)
Chloride: 102 mmol/L (ref 96–106)
Creatinine, Ser: 0.79 mg/dL (ref 0.57–1.00)
Globulin, Total: 2.8 g/dL (ref 1.5–4.5)
Glucose: 102 mg/dL — ABNORMAL HIGH (ref 70–99)
Potassium: 4.5 mmol/L (ref 3.5–5.2)
Sodium: 143 mmol/L (ref 134–144)
Total Protein: 7.4 g/dL (ref 6.0–8.5)
eGFR: 74 mL/min/{1.73_m2} (ref >59)

## 2022-09-03 LAB — LIPID PANEL
Chol/HDL Ratio: 2.3 ratio (ref 0.0–4.4)
Cholesterol, Total: 155 mg/dL (ref 100–199)
HDL: 67 mg/dL (ref >39)
LDL Chol Calc (NIH): 71 mg/dL (ref 0–99)
Triglycerides: 90 mg/dL (ref 0–149)
VLDL Cholesterol Cal: 17 mg/dL (ref 5–40)

## 2022-09-03 LAB — TSH: TSH: 1.8 u[IU]/mL (ref 0.450–4.500)

## 2022-10-28 DIAGNOSIS — M9901 Segmental and somatic dysfunction of cervical region: Secondary | ICD-10-CM | POA: Diagnosis not present

## 2022-10-28 DIAGNOSIS — M9903 Segmental and somatic dysfunction of lumbar region: Secondary | ICD-10-CM | POA: Diagnosis not present

## 2022-10-28 DIAGNOSIS — M9904 Segmental and somatic dysfunction of sacral region: Secondary | ICD-10-CM | POA: Diagnosis not present

## 2022-10-28 DIAGNOSIS — M5137 Other intervertebral disc degeneration, lumbosacral region: Secondary | ICD-10-CM | POA: Diagnosis not present

## 2022-12-11 DIAGNOSIS — H40033 Anatomical narrow angle, bilateral: Secondary | ICD-10-CM | POA: Diagnosis not present

## 2022-12-11 DIAGNOSIS — H2513 Age-related nuclear cataract, bilateral: Secondary | ICD-10-CM | POA: Diagnosis not present

## 2023-02-21 ENCOUNTER — Encounter: Payer: Self-pay | Admitting: Family

## 2023-02-21 ENCOUNTER — Ambulatory Visit (INDEPENDENT_AMBULATORY_CARE_PROVIDER_SITE_OTHER): Payer: Medicare Other | Admitting: Family

## 2023-02-21 VITALS — BP 136/55 | HR 61 | Temp 97.6°F | Ht 64.0 in | Wt 154.0 lb

## 2023-02-21 DIAGNOSIS — I10 Essential (primary) hypertension: Secondary | ICD-10-CM

## 2023-02-21 DIAGNOSIS — M25511 Pain in right shoulder: Secondary | ICD-10-CM | POA: Diagnosis not present

## 2023-02-21 DIAGNOSIS — M545 Low back pain, unspecified: Secondary | ICD-10-CM

## 2023-02-21 DIAGNOSIS — G8929 Other chronic pain: Secondary | ICD-10-CM | POA: Diagnosis not present

## 2023-02-21 DIAGNOSIS — E559 Vitamin D deficiency, unspecified: Secondary | ICD-10-CM | POA: Diagnosis not present

## 2023-02-21 DIAGNOSIS — F411 Generalized anxiety disorder: Secondary | ICD-10-CM | POA: Diagnosis not present

## 2023-02-21 DIAGNOSIS — E785 Hyperlipidemia, unspecified: Secondary | ICD-10-CM | POA: Diagnosis not present

## 2023-02-21 DIAGNOSIS — M159 Polyosteoarthritis, unspecified: Secondary | ICD-10-CM | POA: Diagnosis not present

## 2023-02-21 DIAGNOSIS — E663 Overweight: Secondary | ICD-10-CM

## 2023-02-21 MED ORDER — METHYLPREDNISOLONE ACETATE 80 MG/ML IJ SUSP
80.0000 mg | Freq: Once | INTRAMUSCULAR | Status: AC
  Administered 2023-02-21: 80 mg via INTRA_ARTICULAR

## 2023-02-21 MED ORDER — BUPIVACAINE HCL 0.25 % IJ SOLN
1.0000 mL | Freq: Once | INTRAMUSCULAR | Status: AC
  Administered 2023-02-21: 1 mL via INTRA_ARTICULAR

## 2023-02-21 NOTE — Patient Instructions (Signed)
Health Maintenance After Age 84 After age 84, you are at a higher risk for certain long-term diseases and infections as well as injuries from falls. Falls are a major cause of broken bones and head injuries in people who are older than age 84. Getting regular preventive care can help to keep you healthy and well. Preventive care includes getting regular testing and making lifestyle changes as recommended by your health care provider. Talk with your health care provider about: Which screenings and tests you should have. A screening is a test that checks for a disease when you have no symptoms. A diet and exercise plan that is right for you. What should I know about screenings and tests to prevent falls? Screening and testing are the best ways to find a health problem early. Early diagnosis and treatment give you the best chance of managing medical conditions that are common after age 84. Certain conditions and lifestyle choices may make you more likely to have a fall. Your health care provider may recommend: Regular vision checks. Poor vision and conditions such as cataracts can make you more likely to have a fall. If you wear glasses, make sure to get your prescription updated if your vision changes. Medicine review. Work with your health care provider to regularly review all of the medicines you are taking, including over-the-counter medicines. Ask your health care provider about any side effects that may make you more likely to have a fall. Tell your health care provider if any medicines that you take make you feel dizzy or sleepy. Strength and balance checks. Your health care provider may recommend certain tests to check your strength and balance while standing, walking, or changing positions. Foot health exam. Foot pain and numbness, as well as not wearing proper footwear, can make you more likely to have a fall. Screenings, including: Osteoporosis screening. Osteoporosis is a condition that causes  the bones to get weaker and break more easily. Blood pressure screening. Blood pressure changes and medicines to control blood pressure can make you feel dizzy. Depression screening. You may be more likely to have a fall if you have a fear of falling, feel depressed, or feel unable to do activities that you used to do. Alcohol use screening. Using too much alcohol can affect your balance and may make you more likely to have a fall. Follow these instructions at home: Lifestyle Do not drink alcohol if: Your health care provider tells you not to drink. If you drink alcohol: Limit how much you have to: 0-1 drink a day for women. 0-2 drinks a day for men. Know how much alcohol is in your drink. In the [place] one drink equals one 12 oz bottle of beer (355 mL), one 5 oz glass of wine (148 mL), or one 1 oz glass of hard liquor (44 mL). Do not use any products that contain nicotine or tobacco. These products include cigarettes, chewing tobacco, and vaping devices, such as e-cigarettes. If you need help quitting, ask your health care provider. Activity  Follow a regular exercise program to stay fit. This will help you maintain your balance. Ask your health care provider what types of exercise are appropriate for you. If you need a cane or walker, use it as recommended by your health care provider. Wear supportive shoes that have nonskid soles. Safety  Remove any tripping hazards, such as rugs, cords, and clutter. Install safety equipment such as grab bars in bathrooms and safety rails on stairs. Keep rooms and walkways   well-lit. General instructions Talk with your health care provider about your risks for falling. Tell your health care provider if: You fall. Be sure to tell your health care provider about all falls, even ones that seem minor. You feel dizzy, tiredness (fatigue), or off-balance. Take over-the-counter and prescription medicines only as told by your health care provider. These include  supplements. Eat a healthy diet and maintain a healthy weight. A healthy diet includes low-fat dairy products, low-fat (lean) meats, and fiber from whole grains, beans, and lots of fruits and vegetables. Stay current with your vaccines. Schedule regular health, dental, and eye exams. Summary Having a healthy lifestyle and getting preventive care can help to protect your health and wellness after age 84. Screening and testing are the best way to find a health problem early and help you avoid having a fall. Early diagnosis and treatment give you the best chance for managing medical conditions that are more common for people who are older than age 84. Falls are a major cause of broken bones and head injuries in people who are older than age 84. Take precautions to prevent a fall at home. Work with your health care provider to learn what changes you can make to improve your health and wellness and to prevent falls. This information is not intended to replace advice given to you by your health care provider. Make sure you discuss any questions you have with your health care provider. Document Revised: 12/29/2020 Document Reviewed: 12/29/2020 Elsevier Patient Education  2024 [employer] Inc.  

## 2023-02-21 NOTE — Progress Notes (Addendum)
Subjective:    Patient ID: Belinda Scott, female    DOB: 1941-05-07, 82 y.o.   MRN: [id number]  Chief Complaint  Patient presents with   Medical Management of Chronic Issues   Shoulder Pain    RIGHT SHOULDER WANTS INJECTIONS   PT presents to the office today for chronic follow up. She reports she can not take Norco  because it causes stomach pains.   She also went to the ED on 06/30/22 for headache. She had a CTA head and Neck that showed, "1. No acute intracranial abnormality.  2. No intracranial large vessel occlusion or significant stenosis.  No hemodynamically significant stenosis in the neck.  3. Likely a 2.8 x 2.5 x 3.0 cm dentigerous cyst centered around an  unerupted tooth along the left maxillary arch.  4. Multinodular thyroid gland with a dominant thyroid nodule  measuring up to 3.2 cm on the right. Recommend further evaluation  with a thyroid ultrasound, if not previously performed. "   However, wants to wait on the thyroid ultrasound.   Shoulder Pain  The pain is present in the right shoulder. This is a chronic problem. The current episode started more than 1 year ago. There has been no history of extremity trauma. The problem occurs constantly. The problem has been waxing and waning. The quality of the pain is described as aching. The pain is at a severity of 8/10. Associated symptoms include stiffness. She has tried NSAIDS and rest for the symptoms.  Hypertension This is a chronic problem. The current episode started more than 1 year ago. The problem has been resolved since onset. Associated symptoms include anxiety and malaise/fatigue. Pertinent negatives include no peripheral edema or shortness of breath. Risk factors for coronary artery disease include dyslipidemia, obesity and sedentary lifestyle. The current treatment provides moderate improvement.  Arthritis Presents for follow-up visit. She complains of pain and stiffness. The symptoms have been stable. Affected  locations include the left knee, right knee, right MCP, left shoulder, right shoulder and left MCP. Her pain is at a severity of 8/10.  Hyperlipidemia This is a chronic problem. The current episode started more than 1 year ago. Exacerbating diseases include obesity. Pertinent negatives include no shortness of breath. Current antihyperlipidemic treatment includes statins. The current treatment provides moderate improvement of lipids. Risk factors for coronary artery disease include hypertension and dyslipidemia.  Anxiety Presents for follow-up visit. Symptoms include excessive worry, irritability, nervous/anxious behavior and restlessness. Patient reports no obsessions or shortness of breath. Symptoms occur most days.    Back Pain This is a chronic problem. The current episode started more than 1 year ago. The problem occurs intermittently. The problem has been waxing and waning since onset. The pain is present in the lumbar spine. The pain is at a severity of 10/10. The pain is moderate. She has tried NSAIDs for the symptoms. The treatment provided moderate relief.      Review of Systems  Constitutional:  Positive for irritability and malaise/fatigue.  Respiratory:  Negative for shortness of breath.   Musculoskeletal:  Positive for arthritis, back pain and stiffness.  Psychiatric/Behavioral:  The patient is nervous/anxious.   All other systems reviewed and are negative.      Objective:   Physical Exam Vitals reviewed.  Constitutional:      General: She is not in acute distress.    Appearance: She is well-developed.  HENT:     Head: Normocephalic and atraumatic.     Right Ear: Tympanic membrane  normal.     Left Ear: Tympanic membrane normal.  Eyes:     Pupils: Pupils are equal, round, and reactive to light.  Neck:     Thyroid: No thyromegaly.  Cardiovascular:     Rate and Rhythm: Normal rate and regular rhythm.     Heart sounds: Normal heart sounds. No murmur heard. Pulmonary:      Effort: Pulmonary effort is normal. No respiratory distress.     Breath sounds: Normal breath sounds. No wheezing.  Abdominal:     General: Bowel sounds are normal. There is no distension.     Palpations: Abdomen is soft.     Tenderness: There is no abdominal tenderness.  Musculoskeletal:        General: No tenderness.     Cervical back: Normal range of motion and neck supple.     Comments: Pain in right shoulder with abduction  Skin:    General: Skin is warm and dry.  Neurological:     Mental Status: She is alert and oriented to person, place, and time.     Cranial Nerves: No cranial nerve deficit.     Deep Tendon Reflexes: Reflexes are normal and symmetric.  Psychiatric:        Behavior: Behavior normal.        Thought Content: Thought content normal.        Judgment: Judgment normal.    Right shoulder prepped with betadine Injected with Marcaine .25% plain and methylprednisolone with 25 guage needle x 1. Patient tolerated well.    BP (!) 136/55   Pulse 61   Temp 97.6 F (36.4 C) (Temporal)   Ht 5\' 4"  (1.626 m)   Wt 154 lb (69.9 kg)   SpO2 95%   BMI 26.43 kg/m      Assessment & Plan:  Belinda Scott comes in today with chief complaint of Medical Management of Chronic Issues and Shoulder Pain (RIGHT SHOULDER WANTS INJECTIONS)   Diagnosis and orders addressed:  1. Chronic bilateral low back pain, unspecified whether sciatica present - CBC with Differential/Platelet - CMP14+EGFR  2. Essential hypertension - CBC with Differential/Platelet - CMP14+EGFR  3. GAD (generalized anxiety disorder) - CBC with Differential/Platelet - CMP14+EGFR  4. Hyperlipidemia, unspecified hyperlipidemia type  - CBC with Differential/Platelet - CMP14+EGFR  5. Osteoarthritis of multiple joints, unspecified osteoarthritis type - CBC with Differential/Platelet - CMP14+EGFR  6. Overweight (BMI 25.0-29.9) - CBC with Differential/Platelet - CMP14+EGFR  7. Vitamin D  deficiency - CBC with Differential/Platelet - CMP14+EGFR  8. Acute pain of right shoulder  - methylPREDNISolone acetate (DEPO-MEDROL) injection 80 mg - bupivacaine (MARCAINE) 0.25 % (with pres) injection 1 mL   Labs pending Health Maintenance reviewed Diet and exercise encouraged  Follow up plan: 6 months    Jannifer Rodney, FNP

## 2023-02-22 LAB — CMP14+EGFR
ALT: 16 IU/L (ref 0–32)
AST: 23 IU/L (ref 0–40)
Albumin: 4.1 g/dL (ref 3.7–4.7)
Alkaline Phosphatase: 147 IU/L — ABNORMAL HIGH (ref 44–121)
BUN/Creatinine Ratio: 28 (ref 12–28)
BUN: 19 mg/dL (ref 8–27)
Bilirubin Total: 0.3 mg/dL (ref 0.0–1.2)
CO2: 23 mmol/L (ref 20–29)
Calcium: 8.8 mg/dL (ref 8.7–10.3)
Chloride: 107 mmol/L — ABNORMAL HIGH (ref 96–106)
Creatinine, Ser: 0.69 mg/dL (ref 0.57–1.00)
Globulin, Total: 3.1 g/dL (ref 1.5–4.5)
Glucose: 101 mg/dL — ABNORMAL HIGH (ref 70–99)
Potassium: 3.7 mmol/L (ref 3.5–5.2)
Sodium: 144 mmol/L (ref 134–144)
Total Protein: 7.2 g/dL (ref 6.0–8.5)
eGFR: 86 mL/min/{1.73_m2} (ref >59)

## 2023-02-22 LAB — CBC WITH DIFFERENTIAL/PLATELET
Basophils Absolute: 0.1 10*3/uL (ref 0.0–0.2)
Basos: 1 %
EOS (ABSOLUTE): 0.3 10*3/uL (ref 0.0–0.4)
Eos: 4 %
Hematocrit: 42.6 % (ref 34.0–46.6)
Hemoglobin: 14.6 g/dL (ref 11.1–15.9)
Immature Grans (Abs): 0 10*3/uL (ref 0.0–0.1)
Immature Granulocytes: 0 %
Lymphocytes Absolute: 3.4 10*3/uL — ABNORMAL HIGH (ref 0.7–3.1)
Lymphs: 44 %
MCH: 30.4 pg (ref 26.6–33.0)
MCHC: 34.3 g/dL (ref 31.5–35.7)
MCV: 89 fL (ref 79–97)
Monocytes Absolute: 0.7 10*3/uL (ref 0.1–0.9)
Monocytes: 10 %
Neutrophils Absolute: 3.2 10*3/uL (ref 1.4–7.0)
Neutrophils: 41 %
Platelets: 183 10*3/uL (ref 150–450)
RBC: 4.81 x10E6/uL (ref 3.77–5.28)
RDW: 12.9 % (ref 11.7–15.4)
WBC: 7.6 10*3/uL (ref 3.4–10.8)

## 2023-04-30 DIAGNOSIS — R110 Nausea: Secondary | ICD-10-CM | POA: Diagnosis not present

## 2023-04-30 DIAGNOSIS — N281 Cyst of kidney, acquired: Secondary | ICD-10-CM | POA: Diagnosis not present

## 2023-04-30 DIAGNOSIS — I447 Left bundle-branch block, unspecified: Secondary | ICD-10-CM | POA: Diagnosis not present

## 2023-04-30 DIAGNOSIS — E785 Hyperlipidemia, unspecified: Secondary | ICD-10-CM | POA: Diagnosis not present

## 2023-04-30 DIAGNOSIS — Z743 Need for continuous supervision: Secondary | ICD-10-CM | POA: Diagnosis not present

## 2023-04-30 DIAGNOSIS — I10 Essential (primary) hypertension: Secondary | ICD-10-CM | POA: Diagnosis not present

## 2023-04-30 DIAGNOSIS — R109 Unspecified abdominal pain: Secondary | ICD-10-CM | POA: Diagnosis not present

## 2023-04-30 DIAGNOSIS — K449 Diaphragmatic hernia without obstruction or gangrene: Secondary | ICD-10-CM | POA: Diagnosis not present

## 2023-04-30 DIAGNOSIS — K429 Umbilical hernia without obstruction or gangrene: Secondary | ICD-10-CM | POA: Diagnosis not present

## 2023-04-30 DIAGNOSIS — R6889 Other general symptoms and signs: Secondary | ICD-10-CM | POA: Diagnosis not present

## 2023-04-30 DIAGNOSIS — R9431 Abnormal electrocardiogram [ECG] [EKG]: Secondary | ICD-10-CM | POA: Diagnosis not present

## 2023-04-30 DIAGNOSIS — R112 Nausea with vomiting, unspecified: Secondary | ICD-10-CM | POA: Diagnosis not present

## 2023-05-04 ENCOUNTER — Ambulatory Visit (INDEPENDENT_AMBULATORY_CARE_PROVIDER_SITE_OTHER): Payer: Medicare Other | Admitting: Family Medicine

## 2023-05-04 ENCOUNTER — Encounter: Payer: Self-pay | Admitting: Family Medicine

## 2023-05-04 VITALS — BP 142/70 | HR 56 | Temp 97.4°F | Ht 64.0 in | Wt 153.0 lb

## 2023-05-04 DIAGNOSIS — J069 Acute upper respiratory infection, unspecified: Secondary | ICD-10-CM | POA: Diagnosis not present

## 2023-05-04 MED ORDER — AZITHROMYCIN 250 MG PO TABS
ORAL_TABLET | ORAL | 0 refills | Status: DC
Start: 2023-05-04 — End: 2023-05-12

## 2023-05-04 MED ORDER — PREDNISONE 20 MG PO TABS
40.0000 mg | ORAL_TABLET | Freq: Every day | ORAL | 0 refills | Status: AC
Start: 2023-05-04 — End: 2023-05-09

## 2023-05-04 NOTE — Progress Notes (Signed)
Subjective:  Patient ID: Belinda Scott, female    DOB: 1940-09-19, 82 y.o.   MRN: [id number]  Patient Care Team: Junie Spencer, FNP as PCP - General (Nurse Practitioner) Moody Bruins as Physician Assistant (Chiropractic Medicine)   Chief Complaint:  Nasal Congestion and Cough   HPI: Belinda Scott is a 82 y.o. female presenting on 05/04/2023 for Nasal Congestion and Cough   Cough This is a new problem. The current episode started in the past 7 days. The problem has been gradually worsening. The problem occurs constantly. The cough is Productive of sputum. Associated symptoms include chills, ear congestion, nasal congestion, postnasal drip, rhinorrhea, shortness of breath and wheezing. Pertinent negatives include no chest pain, ear pain, fever, headaches, heartburn, hemoptysis, myalgias, rash, sore throat, sweats or weight loss. She has tried OTC cough suppressant and rest for the symptoms. The treatment provided mild relief.     Relevant past medical, surgical, family, and social history reviewed and updated as indicated.  Allergies and medications reviewed and updated. Data reviewed: Chart in Epic.   Past Medical History:  Diagnosis Date   Hyperlipidemia    Hypertension    Low back pain     Past Surgical History:  Procedure Laterality Date   ABDOMINAL HYSTERECTOMY     APPENDECTOMY     CHOLECYSTECTOMY     OVARIAN CYST SURGERY     TONSILLECTOMY      Social History   Socioeconomic History   Marital status: Married    Spouse name: Jose   Number of children: 3   Years of education: Not on file   Highest education level: Not on file  Occupational History   Occupation: retired  Tobacco Use   Smoking status: Never   Smokeless tobacco: Never  Substance and Sexual Activity   Alcohol use: No   Drug use: No   Sexual activity: Not on file  Other Topics Concern   Not on file  Social History Narrative   Lives home with her husband on one level   Her  children live a couple of hours away   Social Determinants of Health   Financial Resource Strain: Low Risk  (06/01/2021)   Overall Financial Resource Strain (CARDIA)    Difficulty of Paying Living Expenses: Not hard at all  Food Insecurity: No Food Insecurity (06/01/2021)   Hunger Vital Sign    Worried About Running Out of Food in the Last Year: Never true    Ran Out of Food in the Last Year: Never true  Transportation Needs: No Transportation Needs (06/01/2021)   PRAPARE - Administrator, Civil Service (Medical): No    Lack of Transportation (Non-Medical): No  Physical Activity: Insufficiently Active (06/01/2021)   Exercise Vital Sign    Days of Exercise per Week: 7 days    Minutes of Exercise per Session: 20 min  Stress: No Stress Concern Present (06/01/2021)   [employer] of Occupational Health - Occupational Stress Questionnaire    Feeling of Stress : Only a little  Social Connections: Moderately Integrated (06/01/2021)   Social Connection and Isolation Panel [NHANES]    Frequency of Communication with Friends and Family: More than three times a week    Frequency of Social Gatherings with Friends and Family: Three times a week    Attends Religious Services: More than 4 times per year    Active Member of Clubs or Organizations: No    Attends Banker Meetings:  Never    Marital Status: Married  Catering manager Violence: Not At Risk (06/01/2021)   Humiliation, Afraid, Rape, and Kick questionnaire    Fear of Current or Ex-Partner: No    Emotionally Abused: No    Physically Abused: No    Sexually Abused: No    Outpatient Encounter Medications as of 05/04/2023  Medication Sig   amLODipine (NORVASC) 5 MG tablet Take 1 tablet (5 mg total) by mouth daily.   Ascorbic Acid 500 MG CHEW Chew 1 each by mouth daily.    azithromycin (ZITHROMAX Z-PAK) 250 MG tablet As directed   benazepril (LOTENSIN) 40 MG tablet Take 1 tablet (40 mg total) by mouth daily.    Butalbital-APAP-Caffeine 50-300-40 MG CAPS Take 1 capsule by mouth every 4 (four) hours as needed.   Cholecalciferol (VITAMIN D3) 2000 units CHEW Chew 1 each by mouth 2 (two) times a week.   lovastatin (MEVACOR) 40 MG tablet TAKE 1 TABLET BY MOUTH AT BEDTIME . APPOINTMENT REQUIRED FOR FUTURE REFILLS   Magnesium 125 MG CAPS Take by mouth 4 (four) times a week.   ondansetron (ZOFRAN-ODT) 4 MG disintegrating tablet DISSOLVE 1 TABLET IN MOUTH EVERY 8 HOURS AS NEEDED FOR NAUSEA FOR VOMITING   OVER THE COUNTER MEDICATION    Potassium 99 MG TABS Take 99 mg by mouth. Taking four times a week.   Over the counter medication.   predniSONE (DELTASONE) 20 MG tablet Take 2 tablets (40 mg total) by mouth daily with breakfast for 5 days.   Probiotic Product (RA PROBIOTIC GUMMIES) CHEW Chew by mouth.   busPIRone (BUSPAR) 5 MG tablet Take 1 tablet (5 mg total) by mouth 2 (two) times daily as needed. (Patient not taking: Reported on 05/04/2023)   furosemide (LASIX) 20 MG tablet Take 1 tablet (20 mg total) by mouth daily. (Patient not taking: Reported on 05/04/2023)   naproxen (NAPROSYN) 500 MG tablet Take 1 tablet (500 mg total) by mouth 2 (two) times daily with a meal. (Patient not taking: Reported on 05/04/2023)   omeprazole (PRILOSEC) 40 MG capsule Take 1 capsule (40 mg total) by mouth daily. (Patient not taking: Reported on 05/04/2023)   No facility-administered encounter medications on file as of 05/04/2023.    Allergies  Allergen Reactions   Meloxicam Other (See Comments)    Heart did not feel right.    Review of Systems  Constitutional:  Positive for activity change, chills and fatigue. Negative for appetite change, diaphoresis, fever, unexpected weight change and weight loss.  HENT:  Positive for congestion, postnasal drip and rhinorrhea. Negative for dental problem, drooling, ear discharge, ear pain, facial swelling, hearing loss, mouth sores, nosebleeds, sinus pressure, sinus pain, sneezing, sore  throat, tinnitus, trouble swallowing and voice change.   Eyes:  Negative for photophobia and visual disturbance.  Respiratory:  Positive for cough, shortness of breath and wheezing. Negative for apnea, hemoptysis, choking, chest tightness and stridor.   Cardiovascular:  Negative for chest pain, palpitations and leg swelling.  Gastrointestinal:  Negative for abdominal pain and heartburn.  Endocrine: Negative for polydipsia, polyphagia and polyuria.  Genitourinary:  Negative for decreased urine volume and difficulty urinating.  Musculoskeletal:  Negative for arthralgias and myalgias.  Skin:  Negative for rash.  Neurological:  Negative for dizziness, tremors, seizures, syncope, facial asymmetry, speech difficulty, weakness, light-headedness, numbness and headaches.  Psychiatric/Behavioral:  Negative for confusion.   All other systems reviewed and are negative.       Objective:  BP (!) 142/70  Pulse (!) 56   Temp (!) 97.4 F (36.3 C) (Temporal)   Ht 5\' 4"  (1.626 m)   Wt 153 lb (69.4 kg)   SpO2 95%   BMI 26.26 kg/m    Wt Readings from Last 3 Encounters:  05/04/23 153 lb (69.4 kg)  02/21/23 154 lb (69.9 kg)  09/02/22 155 lb 12.8 oz (70.7 kg)    Physical Exam Vitals and nursing note reviewed.  Constitutional:      General: She is not in acute distress.    Appearance: Normal appearance. She is well-developed, well-groomed and overweight. She is not ill-appearing, toxic-appearing or diaphoretic.  HENT:     Head: Normocephalic and atraumatic.     Jaw: There is normal jaw occlusion.     Right Ear: Hearing, tympanic membrane, ear canal and external ear normal.     Left Ear: Hearing, tympanic membrane, ear canal and external ear normal.     Nose: Congestion present.     Mouth/Throat:     Lips: Pink.     Mouth: Mucous membranes are moist.     Pharynx: Oropharynx is clear. Uvula midline.  Eyes:     General: Lids are normal.     Extraocular Movements: Extraocular movements intact.      Conjunctiva/sclera: Conjunctivae normal.     Pupils: Pupils are equal, round, and reactive to light.  Neck:     Thyroid: No thyroid mass, thyromegaly or thyroid tenderness.     Vascular: No carotid bruit or JVD.     Trachea: Trachea and phonation normal.  Cardiovascular:     Rate and Rhythm: Normal rate and regular rhythm.     Chest Wall: PMI is not displaced.     Pulses: Normal pulses.     Heart sounds: Normal heart sounds. No murmur heard.    No friction rub. No gallop.  Pulmonary:     Effort: Pulmonary effort is normal. No respiratory distress.     Breath sounds: No stridor. Wheezing (minimal) present. No rhonchi or rales.  Chest:     Chest wall: No tenderness.  Abdominal:     General: Bowel sounds are normal. There is no distension or abdominal bruit.     Palpations: Abdomen is soft. There is no hepatomegaly or splenomegaly.     Tenderness: There is no abdominal tenderness. There is no right CVA tenderness or left CVA tenderness.     Hernia: No hernia is present.  Musculoskeletal:        General: Normal range of motion.     Cervical back: Normal range of motion and neck supple.     Right lower leg: No edema.     Left lower leg: No edema.  Lymphadenopathy:     Cervical: No cervical adenopathy.  Skin:    General: Skin is warm and dry.     Capillary Refill: Capillary refill takes less than 2 seconds.     Coloration: Skin is not cyanotic, jaundiced or pale.     Findings: No rash.  Neurological:     General: No focal deficit present.     Mental Status: She is alert and oriented to person, place, and time.     Sensory: Sensation is intact.     Motor: Motor function is intact.     Coordination: Coordination is intact.     Gait: Gait is intact.     Deep Tendon Reflexes: Reflexes are normal and symmetric.  Psychiatric:        Attention and Perception:  Attention and perception normal.        Mood and Affect: Mood and affect normal.        Speech: Speech normal.         Behavior: Behavior normal. Behavior is cooperative.        Thought Content: Thought content normal.        Cognition and Memory: Cognition and memory normal.        Judgment: Judgment normal.     Results for orders placed or performed in visit on 02/21/23  CBC with Differential/Platelet  Result Value Ref Range   WBC 7.6 3.4 - 10.8 x10E3/uL   RBC 4.81 3.77 - 5.28 x10E6/uL   Hemoglobin 14.6 11.1 - 15.9 g/dL   Hematocrit 34.7 42.5 - 46.6 %   MCV 89 79 - 97 fL   MCH 30.4 26.6 - 33.0 pg   MCHC 34.3 31.5 - 35.7 g/dL   RDW 95.6 38.7 - 56.4 %   Platelets 183 150 - 450 x10E3/uL   Neutrophils 41 Not Estab. %   Lymphs 44 Not Estab. %   Monocytes 10 Not Estab. %   Eos 4 Not Estab. %   Basos 1 Not Estab. %   Neutrophils Absolute 3.2 1.4 - 7.0 x10E3/uL   Lymphocytes Absolute 3.4 (H) 0.7 - 3.1 x10E3/uL   Monocytes Absolute 0.7 0.1 - 0.9 x10E3/uL   EOS (ABSOLUTE) 0.3 0.0 - 0.4 x10E3/uL   Basophils Absolute 0.1 0.0 - 0.2 x10E3/uL   Immature Granulocytes 0 Not Estab. %   Immature Grans (Abs) 0.0 0.0 - 0.1 x10E3/uL  CMP14+EGFR  Result Value Ref Range   Glucose 101 (H) 70 - 99 mg/dL   BUN 19 8 - 27 mg/dL   Creatinine, Ser 3.32 0.57 - 1.00 mg/dL   eGFR 86 >95 JO/ACZ/6.60   BUN/Creatinine Ratio 28 12 - 28   Sodium 144 134 - 144 mmol/L   Potassium 3.7 3.5 - 5.2 mmol/L   Chloride 107 (H) 96 - 106 mmol/L   CO2 23 20 - 29 mmol/L   Calcium 8.8 8.7 - 10.3 mg/dL   Total Protein 7.2 6.0 - 8.5 g/dL   Albumin 4.1 3.7 - 4.7 g/dL   Globulin, Total 3.1 1.5 - 4.5 g/dL   Bilirubin Total 0.3 0.0 - 1.2 mg/dL   Alkaline Phosphatase 147 (H) 44 - 121 IU/L   AST 23 0 - 40 IU/L   ALT 16 0 - 32 IU/L       Pertinent labs & imaging results that were available during my care of the patient were reviewed by me and considered in my medical decision making.  Assessment & Plan:  Belinda Scott was seen today for nasal congestion and cough.  Diagnoses and all orders for this visit:  URI with cough and  congestion Ongoing and worsening symptoms. Will treat with below. Symptomatic care discussed in detail. Report new, worsening or persistent symptoms.  -     azithromycin (ZITHROMAX Z-PAK) 250 MG tablet; As directed -     predniSONE (DELTASONE) 20 MG tablet; Take 2 tablets (40 mg total) by mouth daily with breakfast for 5 days.     Continue all other maintenance medications.  Follow up plan: Return if symptoms worsen or fail to improve.   Continue healthy lifestyle choices, including diet (rich in fruits, vegetables, and lean proteins, and low in salt and simple carbohydrates) and exercise (at least 30 minutes of moderate physical activity daily).   The above assessment and management plan  was discussed with the patient. The patient verbalized understanding of and has agreed to the management plan. Patient is aware to call the clinic if they develop any new symptoms or if symptoms persist or worsen. Patient is aware when to return to the clinic for a follow-up visit. Patient educated on when it is appropriate to go to the emergency department.   Kari Baars, FNP-C [facility] [phone]

## 2023-05-12 ENCOUNTER — Encounter: Payer: Self-pay | Admitting: Family Medicine

## 2023-05-12 ENCOUNTER — Ambulatory Visit (INDEPENDENT_AMBULATORY_CARE_PROVIDER_SITE_OTHER): Payer: Medicare Other | Admitting: Family Medicine

## 2023-05-12 VITALS — BP 165/69 | HR 60 | Temp 97.4°F | Ht 64.0 in | Wt 153.4 lb

## 2023-05-12 DIAGNOSIS — N3001 Acute cystitis with hematuria: Secondary | ICD-10-CM

## 2023-05-12 DIAGNOSIS — R300 Dysuria: Secondary | ICD-10-CM

## 2023-05-12 DIAGNOSIS — B3731 Acute candidiasis of vulva and vagina: Secondary | ICD-10-CM

## 2023-05-12 LAB — MICROSCOPIC EXAMINATION: Renal Epithel, UA: NONE SEEN /hpf

## 2023-05-12 LAB — URINALYSIS, ROUTINE W REFLEX MICROSCOPIC
Bilirubin, UA: NEGATIVE
Glucose, UA: NEGATIVE
Ketones, UA: NEGATIVE
Nitrite, UA: NEGATIVE
Specific Gravity, UA: 1.02 (ref 1.005–1.030)
Urobilinogen, Ur: 0.2 mg/dL (ref 0.2–1.0)
pH, UA: 7 (ref 5.0–7.5)

## 2023-05-12 MED ORDER — MICONAZOLE 3 200 MG VA SUPP: 200.0000 mg | Freq: Every day | VAGINAL | 0 refills | Status: AC

## 2023-05-12 MED ORDER — CEPHALEXIN 500 MG PO CAPS
500.0000 mg | ORAL_CAPSULE | Freq: Two times a day (BID) | ORAL | 0 refills | Status: AC
Start: 2023-05-12 — End: 2023-05-17

## 2023-05-12 NOTE — Progress Notes (Signed)
Subjective:  Patient ID: Belinda Scott, female    DOB: 1940/12/27, 82 y.o.   MRN: [id number]  Patient Care Team: Junie Spencer, FNP as PCP - General (Nurse Practitioner) Moody Bruins as Physician Assistant (Chiropractic Medicine)   Chief Complaint:  Dysuria (X 2 days )   HPI: Belinda Scott is a 82 y.o. female presenting on 05/12/2023 for Dysuria (X 2 days )   Pt presents today with complaints of vaginal itching and dysuria. She was recently on antibiotics for an URI.   Dysuria  This is a new problem. The current episode started in the past 7 days. The problem occurs every urination. The problem has been waxing and waning. The quality of the pain is described as burning and aching. The pain is mild. There has been no fever. She is Not sexually active. There is No history of pyelonephritis. Associated symptoms include frequency and urgency. Pertinent negatives include no chills, discharge, flank pain, hematuria, hesitancy, nausea, possible pregnancy, sweats or vomiting. She has tried increased fluids for the symptoms. The treatment provided no relief.  Vaginal Itching The patient's primary symptoms include genital itching. The patient's pertinent negatives include no genital lesions, genital odor, genital rash, pelvic pain, vaginal bleeding or vaginal discharge. This is a new problem. The current episode started in the past 7 days. The problem occurs constantly. The problem has been waxing and waning. The problem affects both sides. She is not pregnant. Associated symptoms include dysuria, frequency and urgency. Pertinent negatives include no abdominal pain, anorexia, back pain, chills, constipation, diarrhea, discolored urine, fever, flank pain, headaches, hematuria, joint pain, joint swelling, nausea, rash, sore throat or vomiting. She has tried nothing for the symptoms. She is not sexually active.       Relevant past medical, surgical, family, and social history reviewed and  updated as indicated.  Allergies and medications reviewed and updated. Data reviewed: Chart in Epic.   Past Medical History:  Diagnosis Date   Hyperlipidemia    Hypertension    Low back pain     Past Surgical History:  Procedure Laterality Date   ABDOMINAL HYSTERECTOMY     APPENDECTOMY     CHOLECYSTECTOMY     OVARIAN CYST SURGERY     TONSILLECTOMY      Social History   Socioeconomic History   Marital status: Married    Spouse name: Jose   Number of children: 3   Years of education: Not on file   Highest education level: Not on file  Occupational History   Occupation: retired  Tobacco Use   Smoking status: Never   Smokeless tobacco: Never  Substance and Sexual Activity   Alcohol use: No   Drug use: No   Sexual activity: Not on file  Other Topics Concern   Not on file  Social History Narrative   Lives home with her husband on one level   Her children live a couple of hours away   Social Determinants of Health   Financial Resource Strain: Low Risk  (06/01/2021)   Overall Financial Resource Strain (CARDIA)    Difficulty of Paying Living Expenses: Not hard at all  Food Insecurity: No Food Insecurity (06/01/2021)   Hunger Vital Sign    Worried About Running Out of Food in the Last Year: Never true    Ran Out of Food in the Last Year: Never true  Transportation Needs: No Transportation Needs (06/01/2021)   PRAPARE - Transportation    Lack  of Transportation (Medical): No    Lack of Transportation (Non-Medical): No  Physical Activity: Insufficiently Active (06/01/2021)   Exercise Vital Sign    Days of Exercise per Week: 7 days    Minutes of Exercise per Session: 20 min  Stress: No Stress Concern Present (06/01/2021)   [employer] of Occupational Health - Occupational Stress Questionnaire    Feeling of Stress : Only a little  Social Connections: Moderately Integrated (06/01/2021)   Social Connection and Isolation Panel [NHANES]    Frequency of  Communication with Friends and Family: More than three times a week    Frequency of Social Gatherings with Friends and Family: Three times a week    Attends Religious Services: More than 4 times per year    Active Member of Clubs or Organizations: No    Attends Banker Meetings: Never    Marital Status: Married  Catering manager Violence: Not At Risk (06/01/2021)   Humiliation, Afraid, Rape, and Kick questionnaire    Fear of Current or Ex-Partner: No    Emotionally Abused: No    Physically Abused: No    Sexually Abused: No    Outpatient Encounter Medications as of 05/12/2023  Medication Sig   amLODipine (NORVASC) 5 MG tablet Take 1 tablet (5 mg total) by mouth daily.   Ascorbic Acid 500 MG CHEW Chew 1 each by mouth daily.    benazepril (LOTENSIN) 40 MG tablet Take 1 tablet (40 mg total) by mouth daily.   busPIRone (BUSPAR) 5 MG tablet Take 1 tablet (5 mg total) by mouth 2 (two) times daily as needed.   Butalbital-APAP-Caffeine 50-300-40 MG CAPS Take 1 capsule by mouth every 4 (four) hours as needed.   cephALEXin (KEFLEX) 500 MG capsule Take 1 capsule (500 mg total) by mouth 2 (two) times daily for 5 days.   Cholecalciferol (VITAMIN D3) 2000 units CHEW Chew 1 each by mouth 2 (two) times a week.   furosemide (LASIX) 20 MG tablet Take 1 tablet (20 mg total) by mouth daily.   lovastatin (MEVACOR) 40 MG tablet TAKE 1 TABLET BY MOUTH AT BEDTIME . APPOINTMENT REQUIRED FOR FUTURE REFILLS   Magnesium 125 MG CAPS Take by mouth 4 (four) times a week.   miconazole (MICONAZOLE 3) 200 MG vaginal suppository Place 1 suppository (200 mg total) vaginally at bedtime for 3 days.   naproxen (NAPROSYN) 500 MG tablet Take 1 tablet (500 mg total) by mouth 2 (two) times daily with a meal.   omeprazole (PRILOSEC) 40 MG capsule Take 1 capsule (40 mg total) by mouth daily.   ondansetron (ZOFRAN-ODT) 4 MG disintegrating tablet DISSOLVE 1 TABLET IN MOUTH EVERY 8 HOURS AS NEEDED FOR NAUSEA FOR VOMITING    OVER THE COUNTER MEDICATION    Potassium 99 MG TABS Take 99 mg by mouth. Taking four times a week.   Over the counter medication.   Probiotic Product (RA PROBIOTIC GUMMIES) CHEW Chew by mouth.   [DISCONTINUED] azithromycin (ZITHROMAX Z-PAK) 250 MG tablet As directed   No facility-administered encounter medications on file as of 05/12/2023.    Allergies  Allergen Reactions   Meloxicam Other (See Comments)    Heart did not feel right.    Review of Systems  Constitutional:  Negative for activity change, appetite change, chills, diaphoresis, fatigue, fever and unexpected weight change.  HENT: Negative.  Negative for sore throat.   Eyes: Negative.  Negative for photophobia and visual disturbance.  Respiratory:  Negative for cough, chest tightness and shortness  of breath.   Cardiovascular:  Negative for chest pain, palpitations and leg swelling.  Gastrointestinal:  Negative for abdominal pain, anorexia, blood in stool, constipation, diarrhea, nausea and vomiting.  Endocrine: Negative.  Negative for polydipsia, polyphagia and polyuria.  Genitourinary:  Positive for dysuria, frequency and urgency. Negative for decreased urine volume, difficulty urinating, dyspareunia, enuresis, flank pain, genital sores, hematuria, hesitancy, menstrual problem, pelvic pain, vaginal bleeding, vaginal discharge and vaginal pain.  Musculoskeletal:  Negative for arthralgias, back pain, joint pain and myalgias.  Skin: Negative.  Negative for rash.  Allergic/Immunologic: Negative.   Neurological:  Negative for dizziness, tremors, seizures, syncope, facial asymmetry, speech difficulty, weakness, light-headedness, numbness and headaches.  Hematological: Negative.   Psychiatric/Behavioral:  Negative for confusion, hallucinations, sleep disturbance and suicidal ideas.   All other systems reviewed and are negative.       Objective:  BP (!) 165/69   Pulse 60   Temp (!) 97.4 F (36.3 C) (Temporal)   Ht 5\' 4"  (1.626  m)   Wt 153 lb 6.4 oz (69.6 kg)   SpO2 93%   BMI 26.33 kg/m    Wt Readings from Last 3 Encounters:  05/12/23 153 lb 6.4 oz (69.6 kg)  05/04/23 153 lb (69.4 kg)  02/21/23 154 lb (69.9 kg)    Physical Exam Vitals and nursing note reviewed.  Constitutional:      General: She is not in acute distress.    Appearance: Normal appearance. She is well-developed and well-groomed. She is not ill-appearing, toxic-appearing or diaphoretic.  HENT:     Head: Normocephalic and atraumatic.     Jaw: There is normal jaw occlusion.     Right Ear: Hearing normal.     Left Ear: Hearing normal.     Nose: Nose normal.     Mouth/Throat:     Lips: Pink.     Mouth: Mucous membranes are moist.     Pharynx: Oropharynx is clear. Uvula midline.  Eyes:     General: Lids are normal.     Extraocular Movements: Extraocular movements intact.     Conjunctiva/sclera: Conjunctivae normal.     Pupils: Pupils are equal, round, and reactive to light.  Neck:     Thyroid: No thyroid mass, thyromegaly or thyroid tenderness.     Vascular: No carotid bruit or JVD.     Trachea: Trachea and phonation normal.  Cardiovascular:     Rate and Rhythm: Normal rate and regular rhythm.     Chest Wall: PMI is not displaced.     Pulses: Normal pulses.     Heart sounds: Normal heart sounds. No murmur heard.    No friction rub. No gallop.  Pulmonary:     Effort: Pulmonary effort is normal. No respiratory distress.     Breath sounds: Normal breath sounds. No wheezing.  Abdominal:     General: Bowel sounds are normal. There is no distension or abdominal bruit.     Palpations: Abdomen is soft. There is no hepatomegaly or splenomegaly.     Tenderness: There is no abdominal tenderness. There is no right CVA tenderness or left CVA tenderness.     Hernia: No hernia is present.  Musculoskeletal:        General: Normal range of motion.     Cervical back: Normal range of motion and neck supple.     Right lower leg: No edema.     Left  lower leg: No edema.  Lymphadenopathy:     Cervical: No cervical adenopathy.  Skin:    General: Skin is warm and dry.     Capillary Refill: Capillary refill takes less than 2 seconds.     Coloration: Skin is not cyanotic, jaundiced or pale.     Findings: No rash.  Neurological:     General: No focal deficit present.     Mental Status: She is alert and oriented to person, place, and time.     Sensory: Sensation is intact.     Motor: Motor function is intact.     Coordination: Coordination is intact.     Gait: Gait is intact.     Deep Tendon Reflexes: Reflexes are normal and symmetric.  Psychiatric:        Attention and Perception: Attention and perception normal.        Mood and Affect: Mood and affect normal.        Speech: Speech normal.        Behavior: Behavior normal. Behavior is cooperative.        Thought Content: Thought content normal.        Cognition and Memory: Cognition and memory normal.        Judgment: Judgment normal.     Results for orders placed or performed in visit on 02/21/23  CBC with Differential/Platelet  Result Value Ref Range   WBC 7.6 3.4 - 10.8 x10E3/uL   RBC 4.81 3.77 - 5.28 x10E6/uL   Hemoglobin 14.6 11.1 - 15.9 g/dL   Hematocrit 91.4 78.2 - 46.6 %   MCV 89 79 - 97 fL   MCH 30.4 26.6 - 33.0 pg   MCHC 34.3 31.5 - 35.7 g/dL   RDW 95.6 21.3 - 08.6 %   Platelets 183 150 - 450 x10E3/uL   Neutrophils 41 Not Estab. %   Lymphs 44 Not Estab. %   Monocytes 10 Not Estab. %   Eos 4 Not Estab. %   Basos 1 Not Estab. %   Neutrophils Absolute 3.2 1.4 - 7.0 x10E3/uL   Lymphocytes Absolute 3.4 (H) 0.7 - 3.1 x10E3/uL   Monocytes Absolute 0.7 0.1 - 0.9 x10E3/uL   EOS (ABSOLUTE) 0.3 0.0 - 0.4 x10E3/uL   Basophils Absolute 0.1 0.0 - 0.2 x10E3/uL   Immature Granulocytes 0 Not Estab. %   Immature Grans (Abs) 0.0 0.0 - 0.1 x10E3/uL  CMP14+EGFR  Result Value Ref Range   Glucose 101 (H) 70 - 99 mg/dL   BUN 19 8 - 27 mg/dL   Creatinine, Ser 5.78 0.57 - 1.00  mg/dL   eGFR 86 >46 NG/EXB/2.84   BUN/Creatinine Ratio 28 12 - 28   Sodium 144 134 - 144 mmol/L   Potassium 3.7 3.5 - 5.2 mmol/L   Chloride 107 (H) 96 - 106 mmol/L   CO2 23 20 - 29 mmol/L   Calcium 8.8 8.7 - 10.3 mg/dL   Total Protein 7.2 6.0 - 8.5 g/dL   Albumin 4.1 3.7 - 4.7 g/dL   Globulin, Total 3.1 1.5 - 4.5 g/dL   Bilirubin Total 0.3 0.0 - 1.2 mg/dL   Alkaline Phosphatase 147 (H) 44 - 121 IU/L   AST 23 0 - 40 IU/L   ALT 16 0 - 32 IU/L       Pertinent labs & imaging results that were available during my care of the patient were reviewed by me and considered in my medical decision making.  Assessment & Plan:  Belinda Scott was seen today for dysuria.  Diagnoses and all orders for this visit:  Dysuria Urinalysis  with 2+ leukocytes, 2+ blood, 2+ protein, many bacteria and yeast present.  -     Urinalysis, Routine w reflex microscopic -     Urine Culture  Acute cystitis with hematuria Will initiate below, culture pending will change if warranted.  -     cephALEXin (KEFLEX) 500 MG capsule; Take 1 capsule (500 mg total) by mouth 2 (two) times daily for 5 days.  Yeast vaginitis Will treat with below. Aware to report persistent symptoms  -     miconazole (MICONAZOLE 3) 200 MG vaginal suppository; Place 1 suppository (200 mg total) vaginally at bedtime for 3 days.     Continue all other maintenance medications.  Follow up plan: Return if symptoms worsen or fail to improve.   Continue healthy lifestyle choices, including diet (rich in fruits, vegetables, and lean proteins, and low in salt and simple carbohydrates) and exercise (at least 30 minutes of moderate physical activity daily).  Educational handout given for UTI  The above assessment and management plan was discussed with the patient. The patient verbalized understanding of and has agreed to the management plan. Patient is aware to call the clinic if they develop any new symptoms or if symptoms persist or worsen. Patient  is aware when to return to the clinic for a follow-up visit. Patient educated on when it is appropriate to go to the emergency department.   Kari Baars, FNP-C [facility] [phone]

## 2023-05-12 NOTE — Patient Instructions (Signed)
Delsym for cough.

## 2023-05-15 LAB — URINE CULTURE

## 2023-06-07 ENCOUNTER — Ambulatory Visit (INDEPENDENT_AMBULATORY_CARE_PROVIDER_SITE_OTHER): Payer: Medicare Other

## 2023-06-07 DIAGNOSIS — Z23 Encounter for immunization: Secondary | ICD-10-CM

## 2023-09-14 ENCOUNTER — Other Ambulatory Visit: Payer: Self-pay | Admitting: Family

## 2023-09-14 DIAGNOSIS — I10 Essential (primary) hypertension: Secondary | ICD-10-CM

## 2023-09-15 ENCOUNTER — Encounter: Payer: Self-pay | Admitting: Family

## 2023-09-15 ENCOUNTER — Ambulatory Visit: Payer: Medicare Other | Admitting: Family

## 2023-09-15 VITALS — BP 155/73 | HR 73 | Temp 98.0°F | Wt 156.0 lb

## 2023-09-15 DIAGNOSIS — M159 Polyosteoarthritis, unspecified: Secondary | ICD-10-CM | POA: Diagnosis not present

## 2023-09-15 DIAGNOSIS — E559 Vitamin D deficiency, unspecified: Secondary | ICD-10-CM

## 2023-09-15 DIAGNOSIS — Z0000 Encounter for general adult medical examination without abnormal findings: Secondary | ICD-10-CM | POA: Diagnosis not present

## 2023-09-15 DIAGNOSIS — E785 Hyperlipidemia, unspecified: Secondary | ICD-10-CM

## 2023-09-15 DIAGNOSIS — E663 Overweight: Secondary | ICD-10-CM

## 2023-09-15 DIAGNOSIS — Z0001 Encounter for general adult medical examination with abnormal findings: Secondary | ICD-10-CM | POA: Diagnosis not present

## 2023-09-15 DIAGNOSIS — R519 Headache, unspecified: Secondary | ICD-10-CM

## 2023-09-15 DIAGNOSIS — G8929 Other chronic pain: Secondary | ICD-10-CM

## 2023-09-15 DIAGNOSIS — M545 Low back pain, unspecified: Secondary | ICD-10-CM

## 2023-09-15 DIAGNOSIS — I10 Essential (primary) hypertension: Secondary | ICD-10-CM | POA: Diagnosis not present

## 2023-09-15 DIAGNOSIS — F411 Generalized anxiety disorder: Secondary | ICD-10-CM

## 2023-09-15 MED ORDER — BENAZEPRIL HCL 40 MG PO TABS: 40.0000 mg | ORAL_TABLET | Freq: Every day | ORAL | 1 refills | Status: DC

## 2023-09-15 MED ORDER — BUTALBITAL-APAP-CAFFEINE 50-300-40 MG PO CAPS: 1.0000 | ORAL_CAPSULE | ORAL | 1 refills | Status: DC | PRN

## 2023-09-15 MED ORDER — AMLODIPINE BESYLATE 5 MG PO TABS: 5.0000 mg | ORAL_TABLET | Freq: Every day | ORAL | 3 refills | Status: DC

## 2023-09-15 MED ORDER — KETOROLAC TROMETHAMINE 60 MG/2ML IM SOLN
60.0000 mg | Freq: Once | INTRAMUSCULAR | Status: AC
  Administered 2023-09-15: 60 mg via INTRAMUSCULAR

## 2023-09-15 MED ORDER — METHYLPREDNISOLONE ACETATE 80 MG/ML IJ SUSP
80.0000 mg | Freq: Once | INTRAMUSCULAR | Status: AC
  Administered 2023-09-15: 80 mg via INTRAMUSCULAR

## 2023-09-15 NOTE — Patient Instructions (Signed)

## 2023-09-15 NOTE — Progress Notes (Signed)
Subjective:    Patient ID: Belinda Scott, female    DOB: 17-Feb-1941, 83 y.o.   MRN: [id number]  Chief Complaint  Patient presents with   Back Pain    Wants a steroid injection    Medical Management of Chronic Issues   PT presents to the office today for CPE and chronic follow up. She reports she can not take Norco  because it causes stomach pains.   She also went to the ED on 06/30/22 for headache. She had a CTA head and Neck that showed, "1. No acute intracranial abnormality.  2. No intracranial large vessel occlusion or significant stenosis.  No hemodynamically significant stenosis in the neck.  3. Likely a 2.8 x 2.5 x 3.0 cm dentigerous cyst centered around an  unerupted tooth along the left maxillary arch.  4. Multinodular thyroid gland with a dominant thyroid nodule  measuring up to 3.2 cm on the right. Recommend further evaluation  with a thyroid ultrasound, if not previously performed. "   However, wants to wait on the thyroid ultrasound.   Shoulder Pain  The pain is present in the right shoulder. This is a chronic problem. The current episode started more than 1 year ago. There has been no history of extremity trauma. The problem occurs constantly. The problem has been waxing and waning. The quality of the pain is described as aching. The pain is at a severity of 8/10. Associated symptoms include stiffness. She has tried NSAIDS and rest for the symptoms.  Hypertension This is a chronic problem. The current episode started more than 1 year ago. The problem has been waxing and waning since onset. The problem is uncontrolled. Associated symptoms include anxiety and malaise/fatigue. Pertinent negatives include no peripheral edema or shortness of breath. Risk factors for coronary artery disease include dyslipidemia, obesity and sedentary lifestyle. The current treatment provides moderate improvement.  Arthritis Presents for follow-up visit. She complains of pain and stiffness. The  symptoms have been stable. Affected locations include the left knee, right knee, right MCP, left shoulder, right shoulder and left MCP. Her pain is at a severity of 8/10.  Hyperlipidemia This is a chronic problem. The current episode started more than 1 year ago. Exacerbating diseases include obesity. Pertinent negatives include no shortness of breath. Current antihyperlipidemic treatment includes statins. The current treatment provides moderate improvement of lipids. Risk factors for coronary artery disease include hypertension and dyslipidemia.  Anxiety Presents for follow-up visit. Symptoms include excessive worry, irritability, nervous/anxious behavior and restlessness. Patient reports no obsessions or shortness of breath. Symptoms occur most days.    Back Pain This is a chronic problem. The current episode started more than 1 year ago. The problem occurs intermittently. The problem has been waxing and waning since onset. The pain is present in the lumbar spine. The pain is at a severity of 10/10. The pain is moderate. She has tried NSAIDs for the symptoms. The treatment provided moderate relief.      Review of Systems  Constitutional:  Positive for irritability and malaise/fatigue.  Respiratory:  Negative for shortness of breath.   Musculoskeletal:  Positive for back pain and stiffness.  Psychiatric/Behavioral:  The patient is nervous/anxious.   All other systems reviewed and are negative.      Objective:   Physical Exam Vitals reviewed.  Constitutional:      General: She is not in acute distress.    Appearance: She is well-developed.  HENT:     Head: Normocephalic and atraumatic.  Right Ear: Tympanic membrane normal.     Left Ear: Tympanic membrane normal.  Eyes:     Pupils: Pupils are equal, round, and reactive to light.  Neck:     Thyroid: No thyromegaly.  Cardiovascular:     Rate and Rhythm: Normal rate and regular rhythm.     Heart sounds: Normal heart sounds. No  murmur heard. Pulmonary:     Effort: Pulmonary effort is normal. No respiratory distress.     Breath sounds: Normal breath sounds. No wheezing.  Abdominal:     General: Bowel sounds are normal. There is no distension.     Palpations: Abdomen is soft.     Tenderness: There is no abdominal tenderness.  Musculoskeletal:        General: No tenderness.     Cervical back: Normal range of motion and neck supple.     Right lower leg: Edema (trace) present.     Left lower leg: Edema (trace) present.     Comments: Pain in right shoulder with abduction, pain in lumbar with flexion   Skin:    General: Skin is warm and dry.  Neurological:     Mental Status: She is alert and oriented to person, place, and time.     Cranial Nerves: No cranial nerve deficit.     Deep Tendon Reflexes: Reflexes are normal and symmetric.  Psychiatric:        Behavior: Behavior normal.        Thought Content: Thought content normal.        Judgment: Judgment normal.    Right shoulder prepped with betadine Injected with Marcaine .25% plain and methylprednisolone with 25 guage needle x 1. Patient tolerated well.    BP (!) 155/78   Pulse 73   Temp 98 F (36.7 C)   Wt 156 lb (70.8 kg)   SpO2 92%   BMI 26.78 kg/m      Assessment & Plan:  Belinda Scott comes in today with chief complaint of Back Pain (Wants a steroid injection ) and Medical Management of Chronic Issues   Diagnosis and orders addressed:  1. Essential hypertension - amLODipine (NORVASC) 5 MG tablet; Take 1 tablet (5 mg total) by mouth daily.  Dispense: 90 tablet; Refill: 3 - benazepril (LOTENSIN) 40 MG tablet; Take 1 tablet (40 mg total) by mouth daily.  Dispense: 90 tablet; Refill: 1 - CMP14+EGFR - CBC with Differential/Platelet  2. Annual physical exam (Primary) - CMP14+EGFR - CBC with Differential/Platelet  3. Chronic bilateral low back pain, unspecified whether sciatica present - Tens unit - CMP14+EGFR - CBC with  Differential/Platelet - methylPREDNISolone acetate (DEPO-MEDROL) injection 80 mg - ketorolac (TORADOL) injection 60 mg  4. Vitamin D deficiency - CMP14+EGFR - CBC with Differential/Platelet  5. Overweight (BMI 25.0-29.9) - CMP14+EGFR - CBC with Differential/Platelet  6. Osteoarthritis of multiple joints, unspecified osteoarthritis type - CMP14+EGFR - CBC with Differential/Platelet - methylPREDNISolone acetate (DEPO-MEDROL) injection 80 mg - ketorolac (TORADOL) injection 60 mg  7. Hyperlipidemia, unspecified hyperlipidemia type - CMP14+EGFR - CBC with Differential/Platelet - Lipid panel  8. GAD (generalized anxiety disorder) - CMP14+EGFR - CBC with Differential/Platelet  9. Chronic nonintractable headache, unspecified headache type - Butalbital-APAP-Caffeine 50-300-40 MG CAPS; Take 1 capsule by mouth every 4 (four) hours as needed.  Dispense: 84 capsule; Refill: 1 - CMP14+EGFR - CBC with Differential/Platelet   Labs pending Continue current medications  Health Maintenance reviewed Diet and exercise encouraged  Follow up plan: 4 months    [facility]  Lendon Colonel, FNP

## 2023-09-16 LAB — CMP14+EGFR
ALT: 13 [IU]/L (ref 0–32)
AST: 19 [IU]/L (ref 0–40)
Albumin: 4.5 g/dL (ref 3.7–4.7)
Alkaline Phosphatase: 136 [IU]/L — ABNORMAL HIGH (ref 44–121)
BUN/Creatinine Ratio: 19 (ref 12–28)
BUN: 16 mg/dL (ref 8–27)
Bilirubin Total: 0.3 mg/dL (ref 0.0–1.2)
CO2: 25 mmol/L (ref 20–29)
Calcium: 8.9 mg/dL (ref 8.7–10.3)
Chloride: 103 mmol/L (ref 96–106)
Creatinine, Ser: 0.83 mg/dL (ref 0.57–1.00)
Globulin, Total: 3.3 g/dL (ref 1.5–4.5)
Glucose: 94 mg/dL (ref 70–99)
Potassium: 3.9 mmol/L (ref 3.5–5.2)
Sodium: 144 mmol/L (ref 134–144)
Total Protein: 7.8 g/dL (ref 6.0–8.5)
eGFR: 69 mL/min/{1.73_m2} (ref >59)

## 2023-09-16 LAB — CBC WITH DIFFERENTIAL/PLATELET
Basophils Absolute: 0.1 10*3/uL (ref 0.0–0.2)
Basos: 1 %
EOS (ABSOLUTE): 0.1 10*3/uL (ref 0.0–0.4)
Eos: 2 %
Hematocrit: 46.4 % (ref 34.0–46.6)
Hemoglobin: 15.6 g/dL (ref 11.1–15.9)
Immature Grans (Abs): 0 10*3/uL (ref 0.0–0.1)
Immature Granulocytes: 0 %
Lymphocytes Absolute: 3.8 10*3/uL — ABNORMAL HIGH (ref 0.7–3.1)
Lymphs: 47 %
MCH: 30.1 pg (ref 26.6–33.0)
MCHC: 33.6 g/dL (ref 31.5–35.7)
MCV: 90 fL (ref 79–97)
Monocytes Absolute: 0.7 10*3/uL (ref 0.1–0.9)
Monocytes: 9 %
Neutrophils Absolute: 3.3 10*3/uL (ref 1.4–7.0)
Neutrophils: 41 %
Platelets: 188 10*3/uL (ref 150–450)
RBC: 5.18 x10E6/uL (ref 3.77–5.28)
RDW: 12.2 % (ref 11.7–15.4)
WBC: 8 10*3/uL (ref 3.4–10.8)

## 2023-09-16 LAB — LIPID PANEL
Chol/HDL Ratio: 4.4 {ratio} (ref 0.0–4.4)
Cholesterol, Total: 241 mg/dL — ABNORMAL HIGH (ref 100–199)
HDL: 55 mg/dL (ref >39)
LDL Chol Calc (NIH): 151 mg/dL — ABNORMAL HIGH (ref 0–99)
Triglycerides: 191 mg/dL — ABNORMAL HIGH (ref 0–149)
VLDL Cholesterol Cal: 35 mg/dL (ref 5–40)

## 2023-11-28 ENCOUNTER — Ambulatory Visit: Payer: Self-pay

## 2023-11-28 MED ORDER — SULINDAC 150 MG PO TABS: 150.0000 mg | ORAL_TABLET | Freq: Two times a day (BID) | ORAL | 2 refills | Status: DC

## 2023-11-28 NOTE — Telephone Encounter (Signed)
 Pt states that she was on sulindac in 2023 and would like to be put back on it. Pt states hx of arthritis. Pt states it helped with that. Pt states that she would just like the med called in for her but would sched appt if PCP requires it. Plz advise.  Copied from CRM [id number]. Topic: Clinical - Medication Question >> Nov 28, 2023 10:04 AM Tiffany B wrote: Reason for CRM: Patient would like to discuss taking sulindac for arthritis. Reason for Disposition  Prescription request for new medicine (not a refill)  Answer Assessment - Initial Assessment Questions 1. DRUG NAME: "What medicine do you need to have refilled?"     sulindac 2. REFILLS REMAINING: "How many refills are remaining?" (Note: The label on the medicine or pill bottle will show how many refills are remaining. If there are no refills remaining, then a renewal may be needed.)     None, at this time it is expired, states that she had this 2023 4. PRESCRIBING HCP: "Who prescribed it?" Reason: If prescribed by specialist, call should be referred to that group.     PCP [facility] wrote for it in 2023 5. SYMPTOMS: "Do you have any symptoms?"     Joint pain, worse in back, hx of arthritis,  Protocols used: Medication Refill and Renewal Call-A-AH

## 2023-11-28 NOTE — Telephone Encounter (Signed)
 sulindac  150 mg twice a day Prescription sent to pharmacy, do not take any other NSAID's while taking this. Stop naprosyn.

## 2023-11-28 NOTE — Telephone Encounter (Signed)
 Patient aware and verbalized understanding.

## 2023-11-29 ENCOUNTER — Other Ambulatory Visit: Payer: Self-pay | Admitting: Family

## 2023-11-29 MED ORDER — SULINDAC 150 MG PO TABS: 150.0000 mg | ORAL_TABLET | Freq: Two times a day (BID) | ORAL | 2 refills | Status: DC

## 2023-11-29 NOTE — Telephone Encounter (Signed)
 Copied from CRM [id number]. Topic: Clinical - Prescription Issue >> Nov 29, 2023 12:46 PM Gery Pray wrote: Reason for CRM: Patient called in and stated that the pharmacy has not received her prescription for sulindac 150 mg twice a day. Please contact patient at [phone] regarding prescription.

## 2023-11-29 NOTE — Telephone Encounter (Signed)
 Was set on phones in please send in

## 2024-01-09 ENCOUNTER — Ambulatory Visit: Payer: HMO | Admitting: Family

## 2024-05-08 ENCOUNTER — Other Ambulatory Visit: Payer: Self-pay | Admitting: Family

## 2024-05-08 DIAGNOSIS — R519 Headache, unspecified: Secondary | ICD-10-CM

## 2024-05-17 ENCOUNTER — Other Ambulatory Visit: Payer: Self-pay | Admitting: Family

## 2024-05-17 DIAGNOSIS — R519 Headache, unspecified: Secondary | ICD-10-CM

## 2024-06-11 ENCOUNTER — Other Ambulatory Visit

## 2024-06-11 ENCOUNTER — Encounter: Payer: Self-pay | Admitting: Family

## 2024-06-11 ENCOUNTER — Ambulatory Visit: Admitting: Family

## 2024-06-11 VITALS — BP 155/71 | HR 76 | Temp 97.0°F | Ht 64.0 in | Wt 158.0 lb

## 2024-06-11 DIAGNOSIS — E785 Hyperlipidemia, unspecified: Secondary | ICD-10-CM | POA: Diagnosis not present

## 2024-06-11 DIAGNOSIS — M159 Polyosteoarthritis, unspecified: Secondary | ICD-10-CM | POA: Diagnosis not present

## 2024-06-11 DIAGNOSIS — K2100 Gastro-esophageal reflux disease with esophagitis, without bleeding: Secondary | ICD-10-CM | POA: Diagnosis not present

## 2024-06-11 DIAGNOSIS — E559 Vitamin D deficiency, unspecified: Secondary | ICD-10-CM

## 2024-06-11 DIAGNOSIS — M545 Low back pain, unspecified: Secondary | ICD-10-CM | POA: Diagnosis not present

## 2024-06-11 DIAGNOSIS — G8929 Other chronic pain: Secondary | ICD-10-CM | POA: Diagnosis not present

## 2024-06-11 DIAGNOSIS — F411 Generalized anxiety disorder: Secondary | ICD-10-CM | POA: Diagnosis not present

## 2024-06-11 DIAGNOSIS — Z23 Encounter for immunization: Secondary | ICD-10-CM | POA: Diagnosis not present

## 2024-06-11 DIAGNOSIS — E663 Overweight: Secondary | ICD-10-CM | POA: Diagnosis not present

## 2024-06-11 DIAGNOSIS — M8580 Other specified disorders of bone density and structure, unspecified site: Secondary | ICD-10-CM

## 2024-06-11 DIAGNOSIS — R519 Headache, unspecified: Secondary | ICD-10-CM

## 2024-06-11 DIAGNOSIS — I10 Essential (primary) hypertension: Secondary | ICD-10-CM | POA: Diagnosis not present

## 2024-06-11 DIAGNOSIS — R600 Localized edema: Secondary | ICD-10-CM

## 2024-06-11 LAB — CMP14+EGFR
ALT: 16 IU/L (ref 0–32)
AST: 23 IU/L (ref 0–40)
Albumin: 4.6 g/dL (ref 3.7–4.7)
Alkaline Phosphatase: 128 IU/L (ref 48–129)
BUN/Creatinine Ratio: 32 — ABNORMAL HIGH (ref 12–28)
BUN: 21 mg/dL (ref 8–27)
Bilirubin Total: 0.7 mg/dL (ref 0.0–1.2)
CO2: 23 mmol/L (ref 20–29)
Calcium: 9.3 mg/dL (ref 8.7–10.3)
Chloride: 104 mmol/L (ref 96–106)
Creatinine, Ser: 0.65 mg/dL (ref 0.57–1.00)
Globulin, Total: 2.8 g/dL (ref 1.5–4.5)
Glucose: 99 mg/dL (ref 70–99)
Potassium: 3.9 mmol/L (ref 3.5–5.2)
Sodium: 142 mmol/L (ref 134–144)
Total Protein: 7.4 g/dL (ref 6.0–8.5)
eGFR: 86 mL/min/1.73 (ref >59)

## 2024-06-11 LAB — CBC WITH DIFFERENTIAL/PLATELET
Basophils Absolute: 0.1 x10E3/uL (ref 0.0–0.2)
Basos: 1 %
EOS (ABSOLUTE): 0.2 x10E3/uL (ref 0.0–0.4)
Eos: 3 %
Hematocrit: 45.7 % (ref 34.0–46.6)
Hemoglobin: 15.4 g/dL (ref 11.1–15.9)
Immature Grans (Abs): 0 x10E3/uL (ref 0.0–0.1)
Immature Granulocytes: 0 %
Lymphocytes Absolute: 3.1 x10E3/uL (ref 0.7–3.1)
Lymphs: 42 %
MCH: 31 pg (ref 26.6–33.0)
MCHC: 33.7 g/dL (ref 31.5–35.7)
MCV: 92 fL (ref 79–97)
Monocytes Absolute: 0.5 x10E3/uL (ref 0.1–0.9)
Monocytes: 7 %
Neutrophils Absolute: 3.5 x10E3/uL (ref 1.4–7.0)
Neutrophils: 47 %
Platelets: 195 x10E3/uL (ref 150–450)
RBC: 4.97 x10E6/uL (ref 3.77–5.28)
RDW: 12.7 % (ref 11.7–15.4)
WBC: 7.3 x10E3/uL (ref 3.4–10.8)

## 2024-06-11 MED ORDER — BENAZEPRIL HCL 40 MG PO TABS: 40.0000 mg | ORAL_TABLET | Freq: Every day | ORAL | 1 refills | Status: AC

## 2024-06-11 MED ORDER — AMLODIPINE BESYLATE 5 MG PO TABS: 5.0000 mg | ORAL_TABLET | Freq: Every day | ORAL | 3 refills | Status: AC

## 2024-06-11 MED ORDER — OMEPRAZOLE 40 MG PO CPDR: 40.0000 mg | DELAYED_RELEASE_CAPSULE | Freq: Every day | ORAL | 1 refills | Status: AC

## 2024-06-11 MED ORDER — SULINDAC 150 MG PO TABS: 150.0000 mg | ORAL_TABLET | Freq: Two times a day (BID) | ORAL | 2 refills | Status: DC

## 2024-06-11 MED ORDER — METHYLPREDNISOLONE ACETATE 80 MG/ML IJ SUSP
80.0000 mg | Freq: Once | INTRAMUSCULAR | Status: AC
  Administered 2024-06-11: 80 mg via INTRAMUSCULAR

## 2024-06-11 MED ORDER — ONDANSETRON 4 MG PO TBDP: ORAL_TABLET | ORAL | 2 refills | Status: DC

## 2024-06-11 MED ORDER — JOURNAVX 50 MG PO TABS: 50.0000 mg | ORAL_TABLET | Freq: Two times a day (BID) | ORAL | 0 refills | Status: AC

## 2024-06-11 NOTE — Patient Instructions (Signed)

## 2024-06-11 NOTE — Progress Notes (Addendum)
 Subjective:    Patient ID: Belinda Scott, female    DOB: October 23, 1940, 83 y.o.   MRN: [id number]  Chief Complaint  Patient presents with   Medical Management of Chronic Issues    Wants a shot for pain   PT presents to the office today for chronic follow up. She reports she can not take Norco  because it causes stomach pains.   She also went to the ED on 06/30/22 for headache. She had a CTA head and Neck that showed, 1. No acute intracranial abnormality.  2. No intracranial large vessel occlusion or significant stenosis.  No hemodynamically significant stenosis in the neck.  3. Likely a 2.8 x 2.5 x 3.0 cm dentigerous cyst centered around an  unerupted tooth along the left maxillary arch.  4. Multinodular thyroid  gland with a dominant thyroid  nodule  measuring up to 3.2 cm on the right. Recommend further evaluation  with a thyroid  ultrasound, if not previously performed.    However, wants to wait on the thyroid  ultrasound.   Shoulder Pain  The pain is present in the right shoulder. This is a chronic problem. The current episode started more than 1 year ago. There has been no history of extremity trauma. The problem occurs constantly. The problem has been waxing and waning. The quality of the pain is described as aching. The pain is at a severity of 7/10. Associated symptoms include stiffness. She has tried NSAIDS and rest for the symptoms.  Hypertension This is a chronic problem. The current episode started more than 1 year ago. The problem has been waxing and waning since onset. The problem is uncontrolled. Associated symptoms include anxiety, malaise/fatigue and peripheral edema. Pertinent negatives include no shortness of breath. Risk factors for coronary artery disease include dyslipidemia, obesity, sedentary lifestyle and post-menopausal state. The current treatment provides moderate improvement.  Arthritis Presents for follow-up visit. She complains of pain and stiffness. The  symptoms have been stable. Affected locations include the left knee, right knee, right MCP, left shoulder, right shoulder and left MCP. Her pain is at a severity of 7/10.  Hyperlipidemia This is a chronic problem. The current episode started more than 1 year ago. The problem is uncontrolled. Recent lipid tests were reviewed and are high. Exacerbating diseases include obesity. Pertinent negatives include no shortness of breath. Current antihyperlipidemic treatment includes statins. The current treatment provides moderate improvement of lipids. Risk factors for coronary artery disease include hypertension and dyslipidemia.  Anxiety Presents for follow-up visit. Symptoms include excessive worry, irritability, nervous/anxious behavior and restlessness. Patient reports no obsessions or shortness of breath. Symptoms occur occasionally. The severity of symptoms is mild.    Back Pain This is a chronic problem. The current episode started more than 1 year ago. The problem occurs intermittently. The problem has been waxing and waning since onset. The pain is present in the lumbar spine. The pain is at a severity of 9/10. The pain is moderate. The symptoms are aggravated by bending. She has tried NSAIDs for the symptoms. The treatment provided moderate relief.      Review of Systems  Constitutional:  Positive for irritability and malaise/fatigue.  Respiratory:  Negative for shortness of breath.   Musculoskeletal:  Positive for back pain and stiffness.  Psychiatric/Behavioral:  The patient is nervous/anxious.   All other systems reviewed and are negative.   Family History  Problem Relation Age of Onset   Dementia Mother    Cancer Father    Social History  Socioeconomic History   Marital status: Married    Spouse name: Jose   Number of children: 3   Years of education: Not on file   Highest education level: Not on file  Occupational History   Occupation: retired  Tobacco Use   Smoking  status: Never   Smokeless tobacco: Never  Substance and Sexual Activity   Alcohol use: No   Drug use: No   Sexual activity: Not on file  Other Topics Concern   Not on file  Social History Narrative   Lives home with her husband on one level   Her children live a couple of hours away   Social Drivers of Corporate investment banker Strain: Low Risk  (06/01/2021)   Overall Financial Resource Strain (CARDIA)    Difficulty of Paying Living Expenses: Not hard at all  Food Insecurity: No Food Insecurity (06/01/2021)   Hunger Vital Sign    Worried About Running Out of Food in the Last Year: Never true    Ran Out of Food in the Last Year: Never true  Transportation Needs: No Transportation Needs (06/01/2021)   PRAPARE - Administrator, Civil Service (Medical): No    Lack of Transportation (Non-Medical): No  Physical Activity: Insufficiently Active (06/01/2021)   Exercise Vital Sign    Days of Exercise per Week: 7 days    Minutes of Exercise per Session: 20 min  Stress: No Stress Concern Present (06/01/2021)   [employer] of Occupational Health - Occupational Stress Questionnaire    Feeling of Stress : Only a little  Social Connections: Moderately Integrated (06/01/2021)   Social Connection and Isolation Panel    Frequency of Communication with Friends and Family: More than three times a week    Frequency of Social Gatherings with Friends and Family: Three times a week    Attends Religious Services: More than 4 times per year    Active Member of Clubs or Organizations: No    Attends Banker Meetings: Never    Marital Status: Married       Objective:   Physical Exam Vitals reviewed.  Constitutional:      General: She is not in acute distress.    Appearance: She is well-developed.  HENT:     Head: Normocephalic and atraumatic.     Right Ear: Tympanic membrane normal.     Left Ear: Tympanic membrane normal.  Eyes:     Pupils: Pupils are equal,  round, and reactive to light.  Neck:     Thyroid : No thyromegaly.  Cardiovascular:     Rate and Rhythm: Normal rate and regular rhythm.     Heart sounds: Normal heart sounds. No murmur heard. Pulmonary:     Effort: Pulmonary effort is normal. No respiratory distress.     Breath sounds: Normal breath sounds. No wheezing.  Abdominal:     General: Bowel sounds are normal. There is no distension.     Palpations: Abdomen is soft.     Tenderness: There is no abdominal tenderness.  Musculoskeletal:        General: No tenderness.     Cervical back: Normal range of motion and neck supple.     Right lower leg: Edema (trace) present.     Left lower leg: Edema (trace) present.     Comments: Pain in right shoulder with abduction, pain in lumbar with flexion   Skin:    General: Skin is warm and dry.  Neurological:  Mental Status: She is alert and oriented to person, place, and time.     Cranial Nerves: No cranial nerve deficit.     Deep Tendon Reflexes: Reflexes are normal and symmetric.  Psychiatric:        Behavior: Behavior normal.        Thought Content: Thought content normal.        Judgment: Judgment normal.      BP (!) 155/71   Pulse 76   Temp (!) 97 F (36.1 C) (Temporal)   Ht 5' 4 (1.626 m)   Wt 158 lb (71.7 kg)   BMI 27.12 kg/m      Assessment & Plan:  Belinda Scott comes in today with chief complaint of Medical Management of Chronic Issues (Wants a shot for pain)   Diagnosis and orders addressed:  1. Essential hypertension - amLODipine  (NORVASC ) 5 MG tablet; Take 1 tablet (5 mg total) by mouth daily.  Dispense: 90 tablet; Refill: 3 - benazepril  (LOTENSIN ) 40 MG tablet; Take 1 tablet (40 mg total) by mouth daily.  Dispense: 90 tablet; Refill: 1 - CMP14+EGFR - CBC with Differential/Platelet  2. Chronic nonintractable headache, unspecified headache type - CMP14+EGFR - CBC with Differential/Platelet  3. Peripheral edema - CMP14+EGFR - CBC with  Differential/Platelet  4. Hyperlipidemia, unspecified hyperlipidemia type  - CMP14+EGFR - CBC with Differential/Platelet  5. Gastroesophageal reflux disease with esophagitis, unspecified whether hemorrhage - omeprazole  (PRILOSEC) 40 MG capsule; Take 1 capsule (40 mg total) by mouth daily.  Dispense: 90 capsule; Refill: 1 - ondansetron  (ZOFRAN -ODT) 4 MG disintegrating tablet; DISSOLVE 1 TABLET IN MOUTH EVERY 8 HOURS AS NEEDED FOR NAUSEA FOR VOMITING  Dispense: 40 tablet; Refill: 2 - CMP14+EGFR - CBC with Differential/Platelet  6. Encounter for immunization - Flu vaccine HIGH DOSE PF(Fluzone Trivalent)  7. Vitamin D  deficiency - CMP14+EGFR - CBC with Differential/Platelet  8. Overweight (BMI 25.0-29.9)  - CMP14+EGFR - CBC with Differential/Platelet  9. Osteoarthritis of multiple joints, unspecified osteoarthritis type (Primary)  - sulindac  (CLINORIL ) 150 MG tablet; Take 1 tablet (150 mg total) by mouth 2 (two) times daily.  Dispense: 180 tablet; Refill: 2 - Suzetrigine (JOURNAVX) 50 MG TABS; Take 50 mg by mouth 2 (two) times daily.  Dispense: 30 tablet; Refill: 0 - CMP14+EGFR - CBC with Differential/Platelet - methylPREDNISolone  acetate (DEPO-MEDROL ) injection 80 mg  10. GAD (generalized anxiety disorder - CMP14+EGFR - CBC with Differential/Platelet  11. Chronic bilateral low back pain, unspecified whether sciatica present - sulindac  (CLINORIL ) 150 MG tablet; Take 1 tablet (150 mg total) by mouth 2 (two) times daily.  Dispense: 180 tablet; Refill: 2 - Suzetrigine (JOURNAVX) 50 MG TABS; Take 50 mg by mouth 2 (two) times daily.  Dispense: 30 tablet; Refill: 0 - CMP14+EGFR - CBC with Differential/Platelet - methylPREDNISolone  acetate (DEPO-MEDROL ) injection 80 mg  12. Osteopenia, unspecified location - CMP14+EGFR - CBC with Differential/Platelet   Labs pending BP is elevated today, but states she forgot to take her medications yesterday and today Will try Journavax for  her to see if this helps.  Steroid given  ROM exercises encouraged Continue current medications  Health Maintenance reviewed Diet and exercise encouraged  Follow up plan: 4 months    Bari Learn, FNP

## 2024-06-12 ENCOUNTER — Telehealth: Payer: Self-pay | Admitting: Family Medicine

## 2024-06-12 ENCOUNTER — Ambulatory Visit: Payer: Self-pay | Admitting: Family

## 2024-06-12 ENCOUNTER — Telehealth: Payer: Self-pay | Admitting: Family

## 2024-06-12 DIAGNOSIS — I10 Essential (primary) hypertension: Secondary | ICD-10-CM

## 2024-06-12 NOTE — Telephone Encounter (Unsigned)
 Copied from CRM [id number]. Topic: Clinical - Medication Question >> Jun 12, 2024  2:34 PM Winona R wrote: Pt returning call about Benazepril  40mg , she picked up today.

## 2024-06-12 NOTE — Progress Notes (Signed)
 Pharmacy Quality Measure Review  This patient is appearing on a report for being at risk of failing the adherence measure for hypertension (ACEi/ARB) medications this calendar year.   Medication: Benazepril  40mg  Last fill date: 07/02 for 90 day supply  6 month refill sent yesterday by PCP, also likely discussed at visit. Attempted to call x2 to encourage patient to fill medication, unable to reach patient.   Angela Baalmann, PharmD [facility] Pharmacist

## 2024-06-12 NOTE — Telephone Encounter (Signed)
 I called and spoke with patient and she confirmed that she does take this medication daily.

## 2024-07-14 DIAGNOSIS — H2513 Age-related nuclear cataract, bilateral: Secondary | ICD-10-CM | POA: Diagnosis not present

## 2024-07-14 DIAGNOSIS — H40033 Anatomical narrow angle, bilateral: Secondary | ICD-10-CM | POA: Diagnosis not present

## 2024-07-31 ENCOUNTER — Encounter: Payer: Self-pay | Admitting: Family

## 2024-07-31 ENCOUNTER — Ambulatory Visit: Admitting: Family

## 2024-07-31 ENCOUNTER — Ambulatory Visit: Payer: Self-pay

## 2024-07-31 VITALS — BP 159/71 | HR 82 | Temp 97.2°F | Ht 64.0 in | Wt 154.0 lb

## 2024-07-31 DIAGNOSIS — R300 Dysuria: Secondary | ICD-10-CM

## 2024-07-31 DIAGNOSIS — N3000 Acute cystitis without hematuria: Secondary | ICD-10-CM

## 2024-07-31 LAB — URINALYSIS, COMPLETE
Bilirubin, UA: NEGATIVE
Ketones, UA: NEGATIVE
Nitrite, UA: POSITIVE — AB
Specific Gravity, UA: 1.015 (ref 1.005–1.030)
Urobilinogen, Ur: 1 mg/dL (ref 0.2–1.0)
pH, UA: 7 (ref 5.0–7.5)

## 2024-07-31 LAB — MICROSCOPIC EXAMINATION
Renal Epithel, UA: NONE SEEN /HPF
Yeast, UA: NONE SEEN

## 2024-07-31 MED ORDER — CEPHALEXIN 500 MG PO CAPS: 500.0000 mg | ORAL_CAPSULE | Freq: Two times a day (BID) | ORAL | 0 refills | Status: DC

## 2024-07-31 NOTE — Telephone Encounter (Signed)
 FYI Only or Action Required?: FYI only for provider: appointment scheduled on 07/31/24.  Patient was last seen in primary care on 06/11/2024 by Lavell Bari LABOR, FNP.  Called Nurse Triage reporting Urinary Tract Infection.  Symptoms began yesterday.  Interventions attempted: OTC medications: azo.  Symptoms are: unchanged.  Triage Disposition: See Physician Within 24 Hours  Patient/caregiver understands and will follow disposition?: Yes  Copied from CRM [id number]. Topic: Clinical - Red Word Triage >> Jul 31, 2024  1:53 PM Antwanette L wrote: Red Word that prompted transfer to Nurse Triage: Possible kidney infection. Patient is reporting chills and pain during urination Reason for Disposition  Age > 50 years  Answer Assessment - Initial Assessment Questions 1. SEVERITY: How bad is the pain?  (e.g., Scale 1-10; mild, moderate, or severe)     8/10;AZO not helpful 2. FREQUENCY: How many times have you had painful urination today?      no 3. PATTERN: Is pain present every time you urinate or just sometimes?      Each time 4. ONSET: When did the painful urination start?      Last night 5. FEVER: Do you have a fever? If Yes, ask: What is your temperature, how was it measured, and when did it start?     Denies fever chills n/v 6. PAST UTI: Have you had a urine infection before? If Yes, ask: When was the last time? and What happened that time?      yes 7. CAUSE: What do you think is causing the painful urination?  (e.g., UTI, scratch, Herpes sore)     uti 8. OTHER SYMPTOMS: Do you have any other symptoms? (e.g., blood in urine, flank pain, genital sores, urgency, vaginal discharge)     Lower abd pain, lower back pain  Protocols used: Urination Pain - Female-A-AH

## 2024-07-31 NOTE — Progress Notes (Signed)
 Subjective:    Patient ID: Belinda Scott, female    DOB: [birth year], 83 y.o.   MRN: [id number]  Chief Complaint  Patient presents with   Dysuria   Pt presents to the office today with dysuria that has comes and goes for the last two weeks, bu worsen this AM.  Dysuria  This is a new problem. The current episode started 1 to 4 weeks ago. The problem occurs intermittently. The problem has been waxing and waning. The quality of the pain is described as burning. The pain is at a severity of 7/10. Associated symptoms include frequency, hesitancy and urgency. Pertinent negatives include no flank pain, hematuria, nausea, sweats or vomiting. She has tried increased fluids (AZO) for the symptoms. The treatment provided moderate relief.      Review of Systems  Gastrointestinal:  Negative for nausea and vomiting.  Genitourinary:  Positive for dysuria, frequency, hesitancy and urgency. Negative for flank pain and hematuria.  All other systems reviewed and are negative.   Social History   Socioeconomic History   Marital status: Married    Spouse name: Belinda Scott   Number of children: 3   Years of education: Not on file   Highest education level: Not on file  Occupational History   Occupation: retired  Tobacco Use   Smoking status: Never   Smokeless tobacco: Never  Substance and Sexual Activity   Alcohol use: No   Drug use: No   Sexual activity: Not on file  Other Topics Concern   Not on file  Social History Narrative   Lives home with her husband on one level   Her children live a couple of hours away   Social Drivers of Corporate Investment Banker Strain: Low Risk  (06/01/2021)   Overall Financial Resource Strain (CARDIA)    Difficulty of Paying Living Expenses: Not hard at all  Food Insecurity: No Food Insecurity (06/01/2021)   Hunger Vital Sign    Worried About Running Out of Food in the Last Year: Never true    Ran Out of Food in the Last Year: Never true  Transportation  Needs: No Transportation Needs (06/01/2021)   PRAPARE - Administrator, Civil Service (Medical): No    Lack of Transportation (Non-Medical): No  Physical Activity: Insufficiently Active (06/01/2021)   Exercise Vital Sign    Days of Exercise per Week: 7 days    Minutes of Exercise per Session: 20 min  Stress: No Stress Concern Present (06/01/2021)   [employer] of Occupational Health - Occupational Stress Questionnaire    Feeling of Stress : Only a little  Social Connections: Moderately Integrated (06/01/2021)   Social Connection and Isolation Panel    Frequency of Communication with Friends and Family: More than three times a week    Frequency of Social Gatherings with Friends and Family: Three times a week    Attends Religious Services: More than 4 times per year    Active Member of Clubs or Organizations: No    Attends Banker Meetings: Never    Marital Status: Married   Family History  Problem Relation Age of Onset   Dementia Mother    Cancer Father         Objective:   Physical Exam Vitals reviewed.  Constitutional:      General: She is not in acute distress.    Appearance: She is well-developed.  HENT:     Head: Normocephalic and atraumatic.  Eyes:  Pupils: Pupils are equal, round, and reactive to light.  Neck:     Thyroid : No thyromegaly.  Cardiovascular:     Rate and Rhythm: Normal rate and regular rhythm.     Heart sounds: Normal heart sounds. No murmur heard. Pulmonary:     Effort: Pulmonary effort is normal. No respiratory distress.     Breath sounds: Normal breath sounds. No wheezing.  Abdominal:     General: Bowel sounds are normal. There is no distension.     Palpations: Abdomen is soft.     Tenderness: There is no abdominal tenderness.  Musculoskeletal:        General: No tenderness. Normal range of motion.     Cervical back: Normal range of motion and neck supple.  Skin:    General: Skin is warm and dry.   Neurological:     Mental Status: She is alert and oriented to person, place, and time.     Cranial Nerves: No cranial nerve deficit.     Deep Tendon Reflexes: Reflexes are normal and symmetric.  Psychiatric:        Behavior: Behavior normal.        Thought Content: Thought content normal.        Judgment: Judgment normal.       BP (!) 194/67   Pulse 82   Temp (!) 97.2 F (36.2 C) (Temporal)   Ht 5' 4 (1.626 m)   Wt 154 lb (69.9 kg)   BMI 26.43 kg/m      Assessment & Plan:  Belinda Scott comes in today with chief complaint of Dysuria   Diagnosis and orders addressed:  1. Dysuria (Primary) - Urinalysis, Complete - Urine Culture  2. Acute cystitis without hematuria Force fluids AZO over the counter X2 days Follow up if symptoms worsen or do not improve  Culture pending - cephALEXin  (KEFLEX ) 500 MG capsule; Take 1 capsule (500 mg total) by mouth 2 (two) times daily.  Dispense: 14 capsule; Refill: 0    Belinda Learn, FNP

## 2024-07-31 NOTE — Patient Instructions (Signed)

## 2024-07-31 NOTE — Telephone Encounter (Signed)
 Apt scheduled.

## 2024-08-02 ENCOUNTER — Ambulatory Visit: Payer: Self-pay | Admitting: Family

## 2024-08-05 LAB — URINE CULTURE

## 2024-09-11 ENCOUNTER — Telehealth: Payer: Self-pay

## 2024-09-11 NOTE — Telephone Encounter (Signed)
 Copied from CRM #[id number]. Topic: Clinical - Medication Question >> Sep 11, 2024  9:09 AM Diannia H wrote: Reason for CRM: Patient is calling because she is needing to speak to the nurse about a medicine that she took in 2023. Butalbital -APAP-Caffeine  50-300-40 MG CAPS/ she is wanting a rx sent to the pharmacy. Could you assist? Patients callback number is [phone]   [facility] [address], [place] - [address] [place] [address] Phone: [phone] Fax: [phone] Hours: Not open 24 hours

## 2024-09-11 NOTE — Telephone Encounter (Signed)
 Needs appt this is controlled

## 2024-09-13 ENCOUNTER — Encounter: Payer: Self-pay | Admitting: Family

## 2024-09-13 ENCOUNTER — Ambulatory Visit: Admitting: Family

## 2024-09-13 VITALS — BP 157/76 | HR 65 | Temp 98.2°F | Ht 64.0 in | Wt 155.0 lb

## 2024-09-13 DIAGNOSIS — E785 Hyperlipidemia, unspecified: Secondary | ICD-10-CM

## 2024-09-13 DIAGNOSIS — E559 Vitamin D deficiency, unspecified: Secondary | ICD-10-CM | POA: Diagnosis not present

## 2024-09-13 DIAGNOSIS — M545 Low back pain, unspecified: Secondary | ICD-10-CM | POA: Diagnosis not present

## 2024-09-13 DIAGNOSIS — E663 Overweight: Secondary | ICD-10-CM | POA: Diagnosis not present

## 2024-09-13 DIAGNOSIS — Z0000 Encounter for general adult medical examination without abnormal findings: Secondary | ICD-10-CM

## 2024-09-13 DIAGNOSIS — G8929 Other chronic pain: Secondary | ICD-10-CM | POA: Diagnosis not present

## 2024-09-13 DIAGNOSIS — M159 Polyosteoarthritis, unspecified: Secondary | ICD-10-CM | POA: Diagnosis not present

## 2024-09-13 DIAGNOSIS — F411 Generalized anxiety disorder: Secondary | ICD-10-CM | POA: Diagnosis not present

## 2024-09-13 DIAGNOSIS — K2100 Gastro-esophageal reflux disease with esophagitis, without bleeding: Secondary | ICD-10-CM | POA: Diagnosis not present

## 2024-09-13 DIAGNOSIS — R519 Headache, unspecified: Secondary | ICD-10-CM

## 2024-09-13 DIAGNOSIS — I10 Essential (primary) hypertension: Secondary | ICD-10-CM

## 2024-09-13 DIAGNOSIS — Z0001 Encounter for general adult medical examination with abnormal findings: Secondary | ICD-10-CM

## 2024-09-13 DIAGNOSIS — M8580 Other specified disorders of bone density and structure, unspecified site: Secondary | ICD-10-CM

## 2024-09-13 MED ORDER — ONDANSETRON 4 MG PO TBDP: ORAL_TABLET | ORAL | 2 refills | Status: AC

## 2024-09-13 MED ORDER — BUTALBITAL-APAP-CAFFEINE 50-300-40 MG PO CAPS: 1.0000 | ORAL_CAPSULE | ORAL | 1 refills | Status: AC | PRN

## 2024-09-13 MED ORDER — SULINDAC 150 MG PO TABS: 150.0000 mg | ORAL_TABLET | Freq: Two times a day (BID) | ORAL | 2 refills | Status: AC

## 2024-09-13 MED ORDER — KETOROLAC TROMETHAMINE 60 MG/2ML IM SOLN
60.0000 mg | Freq: Once | INTRAMUSCULAR | Status: AC
  Administered 2024-09-13: 60 mg via INTRAMUSCULAR

## 2024-09-13 NOTE — Patient Instructions (Signed)
 Hypertension, Adult High blood pressure (hypertension) is when the force of blood pumping through the arteries is too strong. The arteries are the blood vessels that carry blood from the heart throughout the body. Hypertension forces the heart to work harder to pump blood and may cause arteries to become narrow or stiff. Untreated or uncontrolled hypertension can lead to a heart attack, heart failure, a stroke, kidney disease, and other problems. A blood pressure reading consists of a higher number over a lower number. Ideally, your blood pressure should be below 120/80. The first ("top") number is called the systolic pressure. It is a measure of the pressure in your arteries as your heart beats. The second ("bottom") number is called the diastolic pressure. It is a measure of the pressure in your arteries as the heart relaxes. What are the causes? The exact cause of this condition is not known. There are some conditions that result in high blood pressure. What increases the risk? Certain factors may make you more likely to develop high blood pressure. Some of these risk factors are under your control, including: Smoking. Not getting enough exercise or physical activity. Being overweight. Having too much fat, sugar, calories, or salt (sodium) in your diet. Drinking too much alcohol. Other risk factors include: Having a personal history of heart disease, diabetes, high cholesterol, or kidney disease. Stress. Having a family history of high blood pressure and high cholesterol. Having obstructive sleep apnea. Age. The risk increases with age. What are the signs or symptoms? High blood pressure may not cause symptoms. Very high blood pressure (hypertensive crisis) may cause: Headache. Fast or irregular heartbeats (palpitations). Shortness of breath. Nosebleed. Nausea and vomiting. Vision changes. Severe chest pain, dizziness, and seizures. How is this diagnosed? This condition is diagnosed by  measuring your blood pressure while you are seated, with your arm resting on a flat surface, your legs uncrossed, and your feet flat on the floor. The cuff of the blood pressure monitor will be placed directly against the skin of your upper arm at the level of your heart. Blood pressure should be measured at least twice using the same arm. Certain conditions can cause a difference in blood pressure between your right and left arms. If you have a high blood pressure reading during one visit or you have normal blood pressure with other risk factors, you may be asked to: Return on a different day to have your blood pressure checked again. Monitor your blood pressure at home for 1 week or longer. If you are diagnosed with hypertension, you may have other blood or imaging tests to help your health care provider understand your overall risk for other conditions. How is this treated? This condition is treated by making healthy lifestyle changes, such as eating healthy foods, exercising more, and reducing your alcohol intake. You may be referred for counseling on a healthy diet and physical activity. Your health care provider may prescribe medicine if lifestyle changes are not enough to get your blood pressure under control and if: Your systolic blood pressure is above 130. Your diastolic blood pressure is above 80. Your personal target blood pressure may vary depending on your medical conditions, your age, and other factors. Follow these instructions at home: Eating and drinking  Eat a diet that is high in fiber and potassium, and low in sodium, added sugar, and fat. An example of this eating plan is called the DASH diet. DASH stands for Dietary Approaches to Stop Hypertension. To eat this way: Eat  plenty of fresh fruits and vegetables. Try to fill one half of your plate at each meal with fruits and vegetables. Eat whole grains, such as whole-wheat pasta, brown rice, or whole-grain bread. Fill about one  fourth of your plate with whole grains. Eat or drink low-fat dairy products, such as skim milk or low-fat yogurt. Avoid fatty cuts of meat, processed or cured meats, and poultry with skin. Fill about one fourth of your plate with lean proteins, such as fish, chicken without skin, beans, eggs, or tofu. Avoid pre-made and processed foods. These tend to be higher in sodium, added sugar, and fat. Reduce your daily sodium intake. Many people with hypertension should eat less than 1,500 mg of sodium a day. Do not drink alcohol if: Your health care provider tells you not to drink. You are pregnant, may be pregnant, or are planning to become pregnant. If you drink alcohol: Limit how much you have to: 0-1 drink a day for women. 0-2 drinks a day for men. Know how much alcohol is in your drink. In the [place] one drink equals one 12 oz bottle of beer (355 mL), one 5 oz glass of wine (148 mL), or one 1 oz glass of hard liquor (44 mL). Lifestyle  Work with your health care provider to maintain a healthy body weight or to lose weight. Ask what an ideal weight is for you. Get at least 30 minutes of exercise that causes your heart to beat faster (aerobic exercise) most days of the week. Activities may include walking, swimming, or biking. Include exercise to strengthen your muscles (resistance exercise), such as Pilates or lifting weights, as part of your weekly exercise routine. Try to do these types of exercises for 30 minutes at least 3 days a week. Do not use any products that contain nicotine or tobacco. These products include cigarettes, chewing tobacco, and vaping devices, such as e-cigarettes. If you need help quitting, ask your health care provider. Monitor your blood pressure at home as told by your health care provider. Keep all follow-up visits. This is important. Medicines Take over-the-counter and prescription medicines only as told by your health care provider. Follow directions carefully. Blood  pressure medicines must be taken as prescribed. Do not skip doses of blood pressure medicine. Doing this puts you at risk for problems and can make the medicine less effective. Ask your health care provider about side effects or reactions to medicines that you should watch for. Contact a health care provider if you: Think you are having a reaction to a medicine you are taking. Have headaches that keep coming back (recurring). Feel dizzy. Have swelling in your ankles. Have trouble with your vision. Get help right away if you: Develop a severe headache or confusion. Have unusual weakness or numbness. Feel faint. Have severe pain in your chest or abdomen. Vomit repeatedly. Have trouble breathing. These symptoms may be an emergency. Get help right away. Call 911. Do not wait to see if the symptoms will go away. Do not drive yourself to the hospital. Summary Hypertension is when the force of blood pumping through your arteries is too strong. If this condition is not controlled, it may put you at risk for serious complications. Your personal target blood pressure may vary depending on your medical conditions, your age, and other factors. For most people, a normal blood pressure is less than 120/80. Hypertension is treated with lifestyle changes, medicines, or a combination of both. Lifestyle changes include losing weight, eating a healthy,  low-sodium diet, exercising more, and limiting alcohol. This information is not intended to replace advice given to you by your health care provider. Make sure you discuss any questions you have with your health care provider. Document Revised: 06/16/2021 Document Reviewed: 06/16/2021 Elsevier Patient Education  2024 [employer].

## 2024-09-13 NOTE — Progress Notes (Signed)
 "  Subjective:    Patient ID: Belinda Scott, female    DOB: [birth year], 84 y.o.   MRN: [id number]  Chief Complaint  Patient presents with   Medical Management of Chronic Issues    Med RF    PT presents to the office today for CPE. She reports she can not take Norco  because it causes stomach pains.   She also went to the ED on 06/30/22 for headache. She had a CTA head and Neck that showed, 1. No acute intracranial abnormality.  2. No intracranial large vessel occlusion or significant stenosis.  No hemodynamically significant stenosis in the neck.  3. Likely a 2.8 x 2.5 x 3.0 cm dentigerous cyst centered around an  unerupted tooth along the left maxillary arch.  4. Multinodular thyroid  gland with a dominant thyroid  nodule  measuring up to 3.2 cm on the right. Recommend further evaluation  with a thyroid  ultrasound, if not previously performed.    However, wants to wait on the thyroid  ultrasound.   She is complaining of headaches today. Reports she has taken Fioricet in the past and that greatly helped. Wants a refill of this.  Shoulder Pain  The pain is present in the right shoulder. This is a chronic problem. The current episode started more than 1 year ago. There has been no history of extremity trauma. The problem occurs constantly. The problem has been waxing and waning. The quality of the pain is described as aching. The pain is at a severity of 7/10. The pain is moderate. Associated symptoms include stiffness. She has tried NSAIDS and rest for the symptoms. The treatment provided moderate relief.  Hypertension This is a chronic problem. The current episode started more than 1 year ago. The problem has been waxing and waning since onset. The problem is uncontrolled. Associated symptoms include anxiety and malaise/fatigue. Pertinent negatives include no peripheral edema or shortness of breath. Risk factors for coronary artery disease include dyslipidemia, obesity, sedentary lifestyle  and post-menopausal state. The current treatment provides moderate improvement.  Arthritis Presents for follow-up visit. She complains of pain and stiffness. The symptoms have been stable. Affected locations include the left knee, right knee, right MCP, left shoulder, right shoulder and left MCP. Her pain is at a severity of 7/10.  Hyperlipidemia This is a chronic problem. The current episode started more than 1 year ago. The problem is uncontrolled. Recent lipid tests were reviewed and are high. Exacerbating diseases include obesity. Pertinent negatives include no shortness of breath. Current antihyperlipidemic treatment includes herbal therapy. The current treatment provides no improvement of lipids. Compliance problems include adherence to diet.  Risk factors for coronary artery disease include hypertension, dyslipidemia and obesity.  Anxiety Presents for follow-up visit. Symptoms include excessive worry, irritability, nervous/anxious behavior and restlessness. Patient reports no obsessions or shortness of breath. Symptoms occur occasionally. The severity of symptoms is mild.    Back Pain This is a chronic problem. The current episode started more than 1 year ago. The problem occurs intermittently. The problem has been waxing and waning since onset. The pain is present in the lumbar spine. The pain is at a severity of 9/10. The pain is moderate. The symptoms are aggravated by bending. She has tried NSAIDs for the symptoms. The treatment provided moderate relief.  Migraine  This is a chronic problem. The current episode started more than 1 year ago. Episode frequency: nightly. The problem has been waxing and waning. The pain is located in the Left unilateral  region. The quality of the pain is described as aching. The pain is at a severity of 10/10. The pain is moderate. Associated symptoms include back pain. She has tried NSAIDs for the symptoms. The treatment provided mild relief. Her past medical  history is significant for obesity.      Review of Systems  Constitutional:  Positive for irritability and malaise/fatigue.  Respiratory:  Negative for shortness of breath.   Musculoskeletal:  Positive for back pain and stiffness.  Psychiatric/Behavioral:  The patient is nervous/anxious.   All other systems reviewed and are negative.   Family History  Problem Relation Age of Onset   Dementia Mother    Cancer Father    Social History   Socioeconomic History   Marital status: Married    Spouse name: Jose   Number of children: 3   Years of education: Not on file   Highest education level: Not on file  Occupational History   Occupation: retired  Tobacco Use   Smoking status: Never   Smokeless tobacco: Never  Substance and Sexual Activity   Alcohol use: No   Drug use: No   Sexual activity: Not on file  Other Topics Concern   Not on file  Social History Narrative   Lives home with her husband on one level   Her children live a couple of hours away   Social Drivers of Health   Tobacco Use: Low Risk (09/13/2024)   Patient History    Smoking Tobacco Use: Never    Smokeless Tobacco Use: Never    Passive Exposure: Not on file  Financial Resource Strain: Not on file  Food Insecurity: Not on file  Transportation Needs: Not on file  Physical Activity: Not on file  Stress: Not on file  Social Connections: Not on file  Depression (PHQ2-9): Low Risk (09/13/2024)   Depression (PHQ2-9)    PHQ-2 Score: 3  Alcohol Screen: Not on file  Housing: Not on file  Utilities: Not on file  Health Literacy: Not on file       Objective:   Physical Exam Vitals reviewed.  Constitutional:      General: She is not in acute distress.    Appearance: She is well-developed.  HENT:     Head: Normocephalic and atraumatic.     Right Ear: Tympanic membrane normal.     Left Ear: Tympanic membrane normal.  Eyes:     Pupils: Pupils are equal, round, and reactive to light.  Neck:      Thyroid : No thyromegaly.  Cardiovascular:     Rate and Rhythm: Normal rate and regular rhythm.     Heart sounds: Normal heart sounds. No murmur heard. Pulmonary:     Effort: Pulmonary effort is normal. No respiratory distress.     Breath sounds: Normal breath sounds. No wheezing.  Abdominal:     General: Bowel sounds are normal. There is no distension.     Palpations: Abdomen is soft.     Tenderness: There is no abdominal tenderness.  Musculoskeletal:        General: No tenderness.     Cervical back: Normal range of motion and neck supple.     Right lower leg: Edema (trace) present.     Left lower leg: Edema (trace) present.     Comments: Pain in right shoulder with abduction, pain in lumbar with flexion   Skin:    General: Skin is warm and dry.  Neurological:     Mental Status: She is  alert and oriented to person, place, and time.     Cranial Nerves: No cranial nerve deficit.     Deep Tendon Reflexes: Reflexes are normal and symmetric.  Psychiatric:        Behavior: Behavior normal.        Thought Content: Thought content normal.        Judgment: Judgment normal.      BP (!) 157/76   Pulse 65   Temp 98.2 F (36.8 C) (Oral)   Ht 5' 4 (1.626 m)   Wt 155 lb (70.3 kg)   SpO2 97%   BMI 26.61 kg/m      Assessment & Plan:  Belinda Scott comes in today with chief complaint of Medical Management of Chronic Issues (Med RF )   Diagnosis and orders addressed:  1. Chronic nonintractable headache, unspecified headache type Will given Toradaol today Will refill fioricet today Stress management  - Butalbital -APAP-Caffeine  50-300-40 MG CAPS; Take 1 capsule by mouth every 4 (four) hours as needed.  Dispense: 84 capsule; Refill: 1 - ketorolac  (TORADOL ) injection 60 mg  2. Osteoarthritis of multiple joints, unspecified osteoarthritis type - sulindac  (CLINORIL ) 150 MG tablet; Take 1 tablet (150 mg total) by mouth 2 (two) times daily.  Dispense: 180 tablet; Refill: 2  3.  Chronic bilateral low back pain, unspecified whether sciatica present - sulindac  (CLINORIL ) 150 MG tablet; Take 1 tablet (150 mg total) by mouth 2 (two) times daily.  Dispense: 180 tablet; Refill: 2  4. Gastroesophageal reflux disease with esophagitis, unspecified whether hemorrhage - ondansetron  (ZOFRAN -ODT) 4 MG disintegrating tablet; DISSOLVE 1 TABLET IN MOUTH EVERY 8 HOURS AS NEEDED FOR NAUSEA FOR VOMITING  Dispense: 40 tablet; Refill: 2  5. Annual physical exam (Primary) Refuses labs today  6. GAD (generalized anxiety disorder) Stress management   7. Hyperlipidemia, unspecified hyperlipidemia type Low fat diet   8. Overweight (BMI 25.0-29.9) Encourage exercise   9. Vitamin D  deficiency  10. Osteopenia, unspecified location   11. Essential hypertension Elevated today Pt states she will not check at home and she's ready to go whenever. She does not want medication changes today.    Refuses labs today Will given Toradaol today Will refill fioricet today BP is elevated today.  Pt states she will not check at home and she's ready to go whenever. She does not want medication changes today.  ROM exercises encouraged Continue current medications  Health Maintenance reviewed Diet and exercise encouraged  Follow up plan: 3 months    Bari Learn, FNP   "

## 2024-09-24 ENCOUNTER — Other Ambulatory Visit (HOSPITAL_COMMUNITY): Payer: Self-pay
